# Patient Record
Sex: Female | Born: 1960 | Race: Black or African American | Hispanic: No | Smoking: Never smoker
Health system: Southern US, Community
[De-identification: ages and names within clinical notes are randomized; demographics above are authoritative.]

## PROBLEM LIST (undated history)

## (undated) DIAGNOSIS — D72819 Decreased white blood cell count, unspecified: Secondary | ICD-10-CM

## (undated) DIAGNOSIS — D649 Anemia, unspecified: Secondary | ICD-10-CM

## (undated) DIAGNOSIS — D472 Monoclonal gammopathy: Secondary | ICD-10-CM

## (undated) HISTORY — PX: ABDOMINAL HYSTERECTOMY: SHX81

## (undated) HISTORY — PX: OTHER SURGICAL HISTORY: SHX169

## (undated) HISTORY — DX: Anemia, unspecified: D64.9

## (undated) HISTORY — DX: Monoclonal gammopathy: D47.2

## (undated) HISTORY — DX: Decreased white blood cell count, unspecified: D72.819

---

## 1998-07-21 ENCOUNTER — Ambulatory Visit (HOSPITAL_COMMUNITY): Admission: RE | Admit: 1998-07-21 | Discharge: 1998-07-21 | Payer: Self-pay | Admitting: Obstetrics and Gynecology

## 1999-09-16 ENCOUNTER — Other Ambulatory Visit: Admission: RE | Admit: 1999-09-16 | Discharge: 1999-09-16 | Payer: Self-pay | Admitting: Obstetrics and Gynecology

## 1999-11-28 DIAGNOSIS — D72819 Decreased white blood cell count, unspecified: Secondary | ICD-10-CM

## 1999-11-28 HISTORY — DX: Decreased white blood cell count, unspecified: D72.819

## 2000-10-11 ENCOUNTER — Encounter: Payer: Self-pay | Admitting: Emergency Medicine

## 2000-10-11 ENCOUNTER — Encounter: Admission: RE | Admit: 2000-10-11 | Discharge: 2000-10-11 | Payer: Self-pay | Admitting: Emergency Medicine

## 2000-12-10 ENCOUNTER — Other Ambulatory Visit: Admission: RE | Admit: 2000-12-10 | Discharge: 2000-12-10 | Payer: Self-pay | Admitting: Obstetrics and Gynecology

## 2001-11-13 ENCOUNTER — Encounter: Payer: Self-pay | Admitting: Emergency Medicine

## 2001-11-13 ENCOUNTER — Encounter: Admission: RE | Admit: 2001-11-13 | Discharge: 2001-11-13 | Payer: Self-pay | Admitting: Emergency Medicine

## 2001-12-27 ENCOUNTER — Other Ambulatory Visit: Admission: RE | Admit: 2001-12-27 | Discharge: 2001-12-27 | Payer: Self-pay | Admitting: Internal Medicine

## 2003-02-03 ENCOUNTER — Encounter: Admission: RE | Admit: 2003-02-03 | Discharge: 2003-02-03 | Payer: Self-pay | Admitting: Emergency Medicine

## 2003-02-03 ENCOUNTER — Encounter: Payer: Self-pay | Admitting: Emergency Medicine

## 2004-02-23 ENCOUNTER — Encounter: Admission: RE | Admit: 2004-02-23 | Discharge: 2004-02-23 | Payer: Self-pay | Admitting: Obstetrics and Gynecology

## 2004-12-20 ENCOUNTER — Inpatient Hospital Stay (HOSPITAL_COMMUNITY): Admission: RE | Admit: 2004-12-20 | Discharge: 2004-12-22 | Payer: Self-pay | Admitting: Obstetrics and Gynecology

## 2004-12-20 ENCOUNTER — Encounter (INDEPENDENT_AMBULATORY_CARE_PROVIDER_SITE_OTHER): Payer: Self-pay | Admitting: *Deleted

## 2005-03-02 ENCOUNTER — Encounter: Admission: RE | Admit: 2005-03-02 | Discharge: 2005-03-02 | Payer: Self-pay | Admitting: Obstetrics and Gynecology

## 2006-03-06 ENCOUNTER — Encounter: Admission: RE | Admit: 2006-03-06 | Discharge: 2006-03-06 | Payer: Self-pay | Admitting: Obstetrics and Gynecology

## 2006-03-30 ENCOUNTER — Encounter: Admission: RE | Admit: 2006-03-30 | Discharge: 2006-03-30 | Payer: Self-pay | Admitting: Obstetrics and Gynecology

## 2006-10-30 ENCOUNTER — Ambulatory Visit (HOSPITAL_COMMUNITY): Admission: RE | Admit: 2006-10-30 | Discharge: 2006-10-30 | Payer: Self-pay | Admitting: Obstetrics and Gynecology

## 2006-11-27 HISTORY — PX: BILATERAL SALPINGOOPHORECTOMY: SHX1223

## 2006-12-06 ENCOUNTER — Ambulatory Visit (HOSPITAL_COMMUNITY): Admission: RE | Admit: 2006-12-06 | Discharge: 2006-12-06 | Payer: Self-pay | Admitting: Obstetrics and Gynecology

## 2007-01-30 ENCOUNTER — Ambulatory Visit: Admission: RE | Admit: 2007-01-30 | Discharge: 2007-01-30 | Payer: Self-pay | Admitting: Gynecologic Oncology

## 2007-03-05 ENCOUNTER — Inpatient Hospital Stay (HOSPITAL_COMMUNITY): Admission: RE | Admit: 2007-03-05 | Discharge: 2007-03-09 | Payer: Self-pay | Admitting: Obstetrics and Gynecology

## 2007-03-05 ENCOUNTER — Encounter (INDEPENDENT_AMBULATORY_CARE_PROVIDER_SITE_OTHER): Payer: Self-pay | Admitting: *Deleted

## 2007-04-16 ENCOUNTER — Ambulatory Visit: Admission: RE | Admit: 2007-04-16 | Discharge: 2007-04-16 | Payer: Self-pay | Admitting: Gynecology

## 2007-12-31 ENCOUNTER — Encounter: Admission: RE | Admit: 2007-12-31 | Discharge: 2008-03-30 | Payer: Self-pay | Admitting: Occupational Medicine

## 2010-12-18 ENCOUNTER — Encounter: Payer: Self-pay | Admitting: Obstetrics and Gynecology

## 2011-01-31 ENCOUNTER — Other Ambulatory Visit: Payer: Self-pay | Admitting: Gynecology

## 2011-01-31 DIAGNOSIS — R928 Other abnormal and inconclusive findings on diagnostic imaging of breast: Secondary | ICD-10-CM

## 2011-02-21 ENCOUNTER — Ambulatory Visit
Admission: RE | Admit: 2011-02-21 | Discharge: 2011-02-21 | Disposition: A | Discharge status: Home / Self Care | Payer: BC Managed Care – PPO | Source: Ambulatory Visit | Attending: Gynecology | Admitting: Gynecology

## 2011-02-21 DIAGNOSIS — R928 Other abnormal and inconclusive findings on diagnostic imaging of breast: Secondary | ICD-10-CM

## 2011-04-11 NOTE — Consult Note (Signed)
[name], [name]            ACCOUNT NO.:  [id number]   MEDICAL RECORD NO.:  [id number]          PATIENT TYPE:  OUT   LOCATION:  GYN                          FACILITY:  [facility]   PHYSICIAN:  De Blanch, M.D.DATE OF BIRTH:  Jan 04, 1961   DATE OF CONSULTATION:  04/16/2007  DATE OF DISCHARGE:                                 CONSULTATION   CHIEF COMPLAINT:  Postoperative visit.   The patient returns today for postoperative visit having undergone a  bilateral salpingo-oophorectomy and lysis of adhesions on March 05, 2007.  Final pathology showed the patient had ovarian inclusion cysts and a  hemorrhagic corpus luteum and follicle cyst with surface adhesions.  The  patient has had an uncomplicated postoperative course.  She does note  that she has mild hot flashes and does not have any night sweats.  After  discussing with her the possibility of using hormone replacement  therapy, she does not think that she has enough symptoms to warrant any  pharmaceutical intervention at this time.   Her energy level was good, and she is planning to return to work  shortly.   PHYSICAL EXAMINATION:  VITAL SIGNS:  Weight 174 pounds.  Blood pressure  127/82.  GENERAL:  He is a healthy African-American female in no acute distress.  HEENT:  Negative.  NECK:  Supple without thyromegaly.  There is no supraclavicular or  inguinal adenopathy.  ABDOMEN:  Soft, nontender.  No mass, organomegaly, ascites or hernias  noted.  All incisions are well healed.  PELVIC:  Exam reveals EG/BUS vagina, bladder, urethra are normal.  Cervix shows uterus surgically absent.  Adnexa without masses.  Rectovaginal exam confirms.   IMPRESSION:  Normal postoperative exam.  I discussed the patient's  pathology with her, reiterating that there was no evidence of  malignancy.   The patient also has a urethral diverticulum and is scheduled to be seen  by the urology department at [facility] in July 2008.  She may return to  work  full time and return to full levels of activity.  She will return to see  Dr. Ambrose Mantle for annual gynecologic examination.      De Blanch, M.D.  Electronically Signed     DC/MEDQ  D:  04/16/2007  T:  04/16/2007  Job:  [id number]   cc:   Malachi Pro. Ambrose Mantle, M.D.  Fax: [phone]   Telford Nab, R.N.  [address]  [place], [place] [address]

## 2011-04-14 NOTE — H&P (Signed)
[name], [name]            ACCOUNT NO.:  [id number]   MEDICAL RECORD NO.:  [id number]          PATIENT TYPE:  INP   LOCATION:  NA                           FACILITY:  [facility]   PHYSICIAN:  John T. Kyla Balzarine, M.D.    DATE OF BIRTH:  [birth year]   DATE OF ADMISSION:  03/05/2007  DATE OF DISCHARGE:                              HISTORY & PHYSICAL   CHIEF COMPLAINT:  Pelvic mass with lymphadenopathy.   HISTORY OF PRESENT ILLNESS:  The patient is a 50 year old [name] 1-0-1-1  woman status post hysterectomy 2 years ago for benign fibroids.  She has  urinary incontinence and was evaluated by Dr. Wanda Plump with fiber optic  cystoscopy consistent with a urethral diverticulum, but no vesicovaginal  fistula.  Pelvic mass was identified by Dr. Ambrose Mantle during a pelvic  examination and CT scan of abdomen and pelvis confirmed a large urethral  diverticulum, hemangiomas within the liver parenchyma, enlarged right  common iliac and left external iliac lymph nodes.  A complex cystic and  solid mass, rising from the right adnexa measured 7.8 x 4.5 cm with a  smaller cystic and solid mass arising from the left adnexa measuring 3.3  x 4.4 cm.  CA-125 value was 8.5.  Original CT scan was October 30, 2006.  On 12/06/2006 transabdominal and transvaginal ultrasounds revealed right  ovarian mass measuring 1.8 x 0.8 x 0.7 cm with an associated cyst in the  left ovary measuring 5.7 x 3.6 x 3.8 cm with a 1.5 cm cyst with solid  heterogeneous mass measuring 3.2 x 2.3 x 3.1 cm.  There were septations  and there was no comment regarding vascular flow.  She is otherwise  asymptomatic with the exception of urinary incontinence, but wishes that  the gyno-urology evaluation be deferred until after surgery for her  adnexal masses.   PAST MEDICAL HISTORY:  No major comorbidities.   PAST SURGICAL HISTORY:  TAH via Pfannenstiel's 2 years ago and cesarean  section via vertical midline incision.   MEDICATIONS CURRENTLY:   None.   ALLERGIES:  None.   PERSONAL SOCIAL HISTORY:  The patient is single and denies tobacco or  ethanol; no illicit drug use.   FAMILY HISTORY:  Mother has diabetes and there is no cancer in the  family.   REVIEW OF SYSTEMS:  Other than urinary incontinence, asymptomatic.   PHYSICAL EXAMINATION:  VITAL SIGNS:  Height 5 feet 9 inches with weight  176 pounds, blood pressure 124/86 and pulse 104; afebrile.  GENERAL:  Well-nourished woman in no acute distress, anxious.  NECK:  Supple with no lymphadenopathy or thyromegaly.  LUNGS:  Clear to auscultation bilaterally.  CARDIOVASCULAR:  Regular rate and rhythm without JVD.  ABDOMEN:  Well-healed vertical and transverse skin incisions without  ascites, fluid wave, distension, palpable masses, hernias, or  organomegaly.  LYMPH SURVEY:  Negative for pathologic lymphadenopathy.  EXTREMITIES:  No edema or calf tenderness.  PELVIC:  Examination was performed by Dr. Duard Brady on 01/30/2007 and was  limited secondary to tenderness from the urethral diverticulum with  urine that can be expressed from the diverticulum with palpation.  On  bimanual examination she noted fullness in the right adnexa, but no  distinct mass or nodularity palpable.  Absent uterus and cervix.  The  rectovaginal was confirmatory, but limited by voluntary guarding.   ASSESSMENT:  1. Complex bilateral adnexal masses with normal CA-125 value.  2. Urethral diverticulum.   PLAN:  The patient was unable to schedule a urogynecology evaluation at  [facility] prior to scheduled surgery for her adnexal masses and wishes  to proceed with surgery as planned on 03/05/2007.  At examination under  anesthesia, we will make a decision regarding the type of incision be  used for exploration.  The patient has been counseled by Dr. Duard Brady  regarding the possibility of comprehensive surgical staging or debulking  if a malignancy is encountered.      John T. Kyla Balzarine, M.D.   Electronically Signed     JTS/MEDQ  D:  02/26/2007  T:  02/27/2007  Job:  [id number]   cc:   Malachi Pro. Ambrose Mantle, M.D.  Fax: [phone]   [place] [employer], M.D.  Fax: [phone]   Telford Nab, R.N.  [address]  [place], [place] [address]

## 2011-04-14 NOTE — Op Note (Signed)
[name], [name]            ACCOUNT NO.:  [id number]   MEDICAL RECORD NO.:  [id number]          PATIENT TYPE:  INP   LOCATION:  1621                         FACILITY:  [facility]   PHYSICIAN:  John T. Kyla Balzarine, M.D.    DATE OF BIRTH:  [birth year]   DATE OF PROCEDURE:  03/05/2007  DATE OF DISCHARGE:                               OPERATIVE REPORT   PREOPERATIVE DIAGNOSIS:  Bilateral cystic adnexal masses following  hysterectomy   POSTOPERATIVE DIAGNOSIS:  Pelvic adhesions with peritoneal inclusion  cyst and benign ovarian cysts.   PROCEDURE:  Exploratory laparotomy with lysis of adhesions and bilateral  salpingo-oophorectomy.   SURGEON:  John T. Kyla Balzarine, M.D.   ASSISTANT:  Malachi Pro. Ambrose Mantle, M.D.   ANESTHESIA:  General endotracheal.   INDICATIONS FOR SURGERY:  This patient had bilateral approximately 4 cm  complex adnexal cysts which were persistent following a hysterectomy.  At the time of surgery, she was found to have extensive adhesions  involving predominantly the sigmoid colon, bladder, tubes and ovaries  and bilateral pelvic sidewalls.  The majority of the cysts were actually  comprised of peritoneal inclusion cysts, but small benign cysts were  present on each ovary.  An excess of 45 minutes were spent lysing  adhesions to free the sigmoid colon from the bladder and adnexa in  addition to the time spent resecting the adnexal structures on each  side.   DESCRIPTION OF PROCEDURE:  The patient was prepped and draped in a low  lithotomy position and explored through a prior Pfannenstiel's incision.  The old keloid scar was resected.  After developing the superior and  inferior fascial flaps with sharp and blunt dissection and  electrocautery, the rectus muscles were split at the midline and  peritoneum entered sharply.  The peritoneal incision was extended and  inspection revealed the findings described above.  The small bowel was  packed out of the pelvis and Bookwalter  retractor placed.  Extensive  adhesiolysis was performed to free the sigmoid colon from the pelvic  sidewalls and bladder and the bilateral adnexal masses.  This was  performed with mainly sharp dissection, although blunt dissection was  occasionally used.  On each side, the medial leaf of the broad ligament  was opened parallel to the infundibulopelvic ligament and tube.  The  pararectal space on each side was developed and ureters were identified  before isolating each infundibulopelvic ligament was.  The IP ligaments  were isolated, clamped, divided and ligated with free tie and suture  ligature of 2-0 Vicryl.   After the sigmoid colon was partially freed, additional dissection was  performed to free each of the adnexal structures from their attachments  to the bladder, vaginal cuff, and posterior cul-de-sac.  Throughout the  dissection, the ureters were visualized and freed from jeopardy.  After  resecting each mass, visual inspection revealed no suspicious areas for  malignancy and the adnexal structures were sent for permanent pathology.  The pelvis was copiously irrigated and additional hemostasis achieved  where needed with focal electrocautery.  Packs and retractors were  removed and the abdominal wall was closed in  layers using running 0  Vicryl to reapproximate rectus muscle bellies, running 0 Vicryl to close  the anterior rectus fascia, 3-0 Vicryl to close the subcutaneous adipose  tissue, and  skin clips to close the skin.  The patient tolerated the procedure well  and was returned to the recovery room in stable condition.  Estimated  blood loss was 100 mL, transfusions none, drains and packs Foley  catheter to dependent drainage.  Sponge and sponge counts correct.      John T. Kyla Balzarine, M.D.  Electronically Signed     JTS/MEDQ  D:  03/05/2007  T:  03/05/2007  Job:  [id number]   cc:   Telford Nab, R.N.  [address]  [place], [place] [address]   Malachi Pro. Ambrose Mantle,  M.D.  Fax: [phone]

## 2011-04-14 NOTE — Discharge Summary (Signed)
NAMEMIKEALA, [name]            ACCOUNT NO.:  [id number]   MEDICAL RECORD NO.:  [id number]          PATIENT TYPE:  INP   LOCATION:  1621                         FACILITY:  [facility]   PHYSICIAN:  Danielle Monday, MD        DATE OF BIRTH:  Jul 21, 1961   DATE OF ADMISSION:  03/05/2007  DATE OF DISCHARGE:  03/09/2007                               DISCHARGE SUMMARY   ADMITTING DIAGNOSES:  1. Pelvic mass.  2. Lymphadenopathy.   DISCHARGE DIAGNOSES:  1. Pelvic mass.  2. Lymphadenopathy.  3. Status post exploratory laparotomy, lysis of adhesions, and      bilateral salpingo-oophorectomy.   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  She has a pelvic mass that was  identified during a pelvic exam, and a CT scan verified this as well.  She had enlarged lymph nodes on this CT scan.  The mass was described as  both cystic and solid arising from the right adnexa, approximately 7.8 x  4.5 cm.  Left adnexa also had a smaller mass approximately 3.3 x 4.4 cm.  A CA 125 was found to be 8.5.  The patient had a consult with [facility]  Gynecology/Oncology and was admitted for a bilateral salpingo-  oophorectomy, possible staging biopsies.   PAST MEDICAL HISTORY:  Not significant.   PAST SURGICAL HISTORY:  Significant for a total abdominal hysterectomy  as well as a cesarean section.   MEDICATIONS:  None.   ALLERGIES:  None.   SOCIAL HISTORY:  The patient denies alcohol, tobacco, or drug use and is  single.   FAMILY HISTORY:  Significant for diabetes in her mother.   REVIEW OF SYSTEMS:  Significant only for the urinary incontinence for  the 14 point review.   On admission, she is afebrile, vital signs stable, was admitted for this  surgery on March 05, 2007.  She had an ex-lap BSO at this time, lysis of  adhesions.  The surgery is performed by Dr. Ronita Hipps and  Dr. Tracey Harries.  She tolerated the procedure well, and her postoperative course  was complicated by a postoperative fever.  Her T-max was approximately  101.4.  A cath UA revealed only red blood cells.  A chest x-ray was  negative.  The workup for the fever was found to be negative.  She was  ambulating well, tolerating diet, passing gas, and, in fact, had a bowel  movement.  She was discharged to home on postoperative day 4, having  remained afebrile for greater than 36 hours with vital signs that were  stable at this time.  She was ambulating without difficulty, anxious to  go home, and her pain was well controlled.  Following the surgery, her  hemoglobin had decreased from 9.5 preoperatively to 8.7 postoperatively.  Exam on the day of discharge, she was in no apparent distress.  Cardiovascular regular rate and rhythm.  Lungs are clear to auscultation  bilaterally.  Abdomen is soft, appropriately tender, and good bowel  sounds.  Staples were removed, and Steri-Strips were applied.  Her  incision was clean, dry, and intact.  Extremities were symmetric and  nontender.  She was discharged to home with routine discharge  instructions and numbers to call with any questions or problems.  She  was discharged to home with prescriptions for Percocet, Motrin, and  iron.  She was advised to take this daily.  She will follow up with Dr.  Ambrose Mantle in approximately 2 weeks.  She voiced understanding to these  instructions and will go home or call us with any questions or problems.      Danielle Monday, MD  Electronically Signed     JB/MEDQ  D:  03/09/2007  T:  03/09/2007  Job:  [id number]

## 2011-06-28 DIAGNOSIS — D472 Monoclonal gammopathy: Secondary | ICD-10-CM

## 2011-06-28 HISTORY — DX: Monoclonal gammopathy: D47.2

## 2011-07-21 ENCOUNTER — Other Ambulatory Visit (HOSPITAL_COMMUNITY): Payer: Self-pay | Admitting: Oncology

## 2011-07-21 ENCOUNTER — Encounter (HOSPITAL_BASED_OUTPATIENT_CLINIC_OR_DEPARTMENT_OTHER): Payer: BC Managed Care – PPO | Admitting: Oncology

## 2011-07-21 DIAGNOSIS — D539 Nutritional anemia, unspecified: Secondary | ICD-10-CM

## 2011-07-21 DIAGNOSIS — D649 Anemia, unspecified: Secondary | ICD-10-CM

## 2011-07-21 DIAGNOSIS — D72819 Decreased white blood cell count, unspecified: Secondary | ICD-10-CM

## 2011-07-21 LAB — COMPREHENSIVE METABOLIC PANEL
ALT: 15 U/L (ref 0–35)
AST: 20 U/L (ref 0–37)
Calcium: 9.9 mg/dL (ref 8.4–10.5)

## 2011-07-21 LAB — CBC WITH DIFFERENTIAL/PLATELET
Basophils Absolute: 0 10*3/uL (ref 0.0–0.1)
EOS%: 0.5 % (ref 0.0–7.0)
HCT: 32.5 % — ABNORMAL LOW (ref 34.8–46.6)
LYMPH%: 46.8 % (ref 14.0–49.7)
MCH: 29.6 pg (ref 25.1–34.0)
MCV: 86.3 fL (ref 79.5–101.0)
MONO#: 0.2 10*3/uL (ref 0.1–0.9)
NEUT#: 1 10*3/uL — ABNORMAL LOW (ref 1.5–6.5)
NEUT%: 42.4 % (ref 38.4–76.8)
RBC: 3.77 10*6/uL (ref 3.70–5.45)
RDW: 14.9 % — ABNORMAL HIGH (ref 11.2–14.5)
lymph#: 1 10*3/uL (ref 0.9–3.3)

## 2011-07-21 LAB — MORPHOLOGY: PLT EST: ADEQUATE

## 2011-07-24 ENCOUNTER — Encounter: Payer: BC Managed Care – PPO | Admitting: Oncology

## 2011-07-24 ENCOUNTER — Other Ambulatory Visit (HOSPITAL_COMMUNITY): Payer: Self-pay | Admitting: Oncology

## 2011-07-24 LAB — FOLATE RBC: RBC Folate: 477 ng/mL (ref >=366)

## 2011-07-24 LAB — RETICULOCYTES (CHCC)
ABS Retic: 35.1 10*3/uL (ref 19.0–186.0)
RBC.: 3.9 MIL/uL (ref 3.87–5.11)

## 2011-07-24 LAB — IRON AND TIBC: UIBC: 241 ug/dL

## 2011-07-24 LAB — VITAMIN B12: Vitamin B-12: 502 pg/mL (ref 211–911)

## 2011-07-26 LAB — IMMUNOFIXATION ELECTROPHORESIS: IgG (Immunoglobin G), Serum: 2570 mg/dL — ABNORMAL HIGH (ref 690–1700)

## 2011-07-28 ENCOUNTER — Other Ambulatory Visit (HOSPITAL_COMMUNITY): Payer: Self-pay | Admitting: Oncology

## 2011-07-28 ENCOUNTER — Encounter (HOSPITAL_BASED_OUTPATIENT_CLINIC_OR_DEPARTMENT_OTHER): Payer: BC Managed Care – PPO | Admitting: Oncology

## 2011-07-28 DIAGNOSIS — D72819 Decreased white blood cell count, unspecified: Secondary | ICD-10-CM

## 2011-07-28 DIAGNOSIS — D649 Anemia, unspecified: Secondary | ICD-10-CM

## 2011-07-28 DIAGNOSIS — D472 Monoclonal gammopathy: Secondary | ICD-10-CM

## 2011-07-28 DIAGNOSIS — D539 Nutritional anemia, unspecified: Secondary | ICD-10-CM

## 2011-08-01 ENCOUNTER — Encounter (HOSPITAL_BASED_OUTPATIENT_CLINIC_OR_DEPARTMENT_OTHER): Payer: BC Managed Care – PPO | Admitting: Oncology

## 2011-08-01 ENCOUNTER — Other Ambulatory Visit (HOSPITAL_COMMUNITY): Payer: Self-pay | Admitting: Oncology

## 2011-08-01 DIAGNOSIS — D472 Monoclonal gammopathy: Secondary | ICD-10-CM

## 2011-08-02 LAB — LACTATE DEHYDROGENASE: LDH: 169 U/L (ref 94–250)

## 2011-08-02 LAB — KAPPA/LAMBDA LIGHT CHAINS
Kappa free light chain: 1.32 mg/dL (ref 0.33–1.94)
Kappa:Lambda Ratio: 1.83 — ABNORMAL HIGH (ref 0.26–1.65)

## 2011-08-02 LAB — BETA 2 MICROGLOBULIN, SERUM: Beta-2 Microglobulin: 1.18 mg/L (ref 1.01–1.73)

## 2011-08-03 ENCOUNTER — Other Ambulatory Visit (HOSPITAL_COMMUNITY): Payer: Self-pay | Admitting: Oncology

## 2011-08-03 DIAGNOSIS — D649 Anemia, unspecified: Secondary | ICD-10-CM

## 2011-08-03 LAB — UIFE/LIGHT CHAINS/TP QN, 24-HR UR
Albumin, U: DETECTED
Alpha 2, Urine: DETECTED — AB
Beta, Urine: DETECTED — AB
Gamma Globulin, Urine: DETECTED — AB
Total Protein, Urine-Ur/day: 32 mg/d (ref 10–140)

## 2011-08-11 ENCOUNTER — Other Ambulatory Visit (HOSPITAL_COMMUNITY): Payer: Self-pay | Admitting: Oncology

## 2011-08-11 ENCOUNTER — Ambulatory Visit (HOSPITAL_COMMUNITY): Payer: BC Managed Care – PPO | Attending: Oncology

## 2011-08-11 DIAGNOSIS — D509 Iron deficiency anemia, unspecified: Secondary | ICD-10-CM | POA: Insufficient documentation

## 2011-08-11 DIAGNOSIS — D472 Monoclonal gammopathy: Secondary | ICD-10-CM

## 2011-08-11 DIAGNOSIS — D72819 Decreased white blood cell count, unspecified: Secondary | ICD-10-CM | POA: Insufficient documentation

## 2011-08-11 DIAGNOSIS — E8809 Other disorders of plasma-protein metabolism, not elsewhere classified: Secondary | ICD-10-CM | POA: Insufficient documentation

## 2011-08-11 LAB — DIFFERENTIAL
Basophils Absolute: 0 10*3/uL (ref 0.0–0.1)
Basophils Relative: 1 % (ref 0–1)
Eosinophils Absolute: 0 10*3/uL (ref 0.0–0.7)
Eosinophils Relative: 1 % (ref 0–5)
Lymphocytes Relative: 49 % — ABNORMAL HIGH (ref 12–46)
Lymphs Abs: 0.9 10*3/uL (ref 0.7–4.0)
Monocytes Absolute: 0.3 10*3/uL (ref 0.1–1.0)
Monocytes Relative: 14 % — ABNORMAL HIGH (ref 3–12)
Neutro Abs: 0.7 10*3/uL — ABNORMAL LOW (ref 1.7–7.7)
Neutrophils Relative %: 35 % — ABNORMAL LOW (ref 43–77)

## 2011-08-11 LAB — CBC
HCT: 31.3 % — ABNORMAL LOW (ref 36.0–46.0)
Hemoglobin: 10.2 g/dL — ABNORMAL LOW (ref 12.0–15.0)
MCH: 28.4 pg (ref 26.0–34.0)
MCHC: 32.6 g/dL (ref 30.0–36.0)
MCV: 87.2 fL (ref 78.0–100.0)
Platelets: 231 10*3/uL (ref 150–400)
RBC: 3.59 MIL/uL — ABNORMAL LOW (ref 3.87–5.11)
RDW: 14.1 % (ref 11.5–15.5)
WBC: 1.9 10*3/uL — ABNORMAL LOW (ref 4.0–10.5)

## 2011-08-16 ENCOUNTER — Ambulatory Visit (HOSPITAL_COMMUNITY)
Admission: RE | Admit: 2011-08-16 | Discharge: 2011-08-16 | Disposition: A | Discharge status: Home / Self Care | Payer: BC Managed Care – PPO | Source: Ambulatory Visit | Attending: Oncology | Admitting: Oncology

## 2011-08-16 DIAGNOSIS — M779 Enthesopathy, unspecified: Secondary | ICD-10-CM | POA: Insufficient documentation

## 2011-08-16 DIAGNOSIS — D72819 Decreased white blood cell count, unspecified: Secondary | ICD-10-CM | POA: Insufficient documentation

## 2011-08-16 DIAGNOSIS — M948X9 Other specified disorders of cartilage, unspecified sites: Secondary | ICD-10-CM | POA: Insufficient documentation

## 2011-08-16 DIAGNOSIS — M479 Spondylosis, unspecified: Secondary | ICD-10-CM | POA: Insufficient documentation

## 2011-08-16 DIAGNOSIS — D649 Anemia, unspecified: Secondary | ICD-10-CM | POA: Insufficient documentation

## 2011-08-16 DIAGNOSIS — M852 Hyperostosis of skull: Secondary | ICD-10-CM | POA: Insufficient documentation

## 2011-08-25 ENCOUNTER — Encounter (HOSPITAL_BASED_OUTPATIENT_CLINIC_OR_DEPARTMENT_OTHER): Payer: BC Managed Care – PPO | Admitting: Oncology

## 2011-08-25 ENCOUNTER — Other Ambulatory Visit (HOSPITAL_COMMUNITY): Payer: Self-pay | Admitting: Oncology

## 2011-08-25 DIAGNOSIS — D472 Monoclonal gammopathy: Secondary | ICD-10-CM

## 2011-08-25 DIAGNOSIS — D72819 Decreased white blood cell count, unspecified: Secondary | ICD-10-CM

## 2011-08-25 DIAGNOSIS — C9000 Multiple myeloma not having achieved remission: Secondary | ICD-10-CM

## 2011-08-25 DIAGNOSIS — D649 Anemia, unspecified: Secondary | ICD-10-CM

## 2011-08-25 DIAGNOSIS — D539 Nutritional anemia, unspecified: Secondary | ICD-10-CM

## 2011-08-25 LAB — CBC WITH DIFFERENTIAL/PLATELET
BASO%: 0.5 % (ref 0.0–2.0)
Eosinophils Absolute: 0 10*3/uL (ref 0.0–0.5)
LYMPH%: 26.2 % (ref 14.0–49.7)
MCHC: 34.1 g/dL (ref 31.5–36.0)
MONO#: 0.3 10*3/uL (ref 0.1–0.9)
NEUT#: 2 10*3/uL (ref 1.5–6.5)
Platelets: 249 10*3/uL (ref 145–400)
RBC: 3.69 10*6/uL — ABNORMAL LOW (ref 3.70–5.45)
RDW: 14.7 % — ABNORMAL HIGH (ref 11.2–14.5)
WBC: 3.1 10*3/uL — ABNORMAL LOW (ref 3.9–10.3)
lymph#: 0.8 10*3/uL — ABNORMAL LOW (ref 0.9–3.3)

## 2011-08-28 LAB — COMPREHENSIVE METABOLIC PANEL
ALT: 11 U/L (ref 0–35)
CO2: 22 mEq/L (ref 19–32)
Creatinine, Ser: 0.64 mg/dL (ref 0.50–1.10)
Glucose, Bld: 99 mg/dL (ref 70–99)
Total Bilirubin: 0.5 mg/dL (ref 0.3–1.2)

## 2011-08-28 LAB — URIC ACID: Uric Acid, Serum: 4.3 mg/dL (ref 2.4–7.0)

## 2011-08-28 LAB — IGG, IGA, IGM
IgA: 141 mg/dL (ref 69–380)
IgG (Immunoglobin G), Serum: 2750 mg/dL — ABNORMAL HIGH (ref 690–1700)
IgM, Serum: 6 mg/dL — ABNORMAL LOW (ref 52–322)

## 2011-08-28 LAB — KAPPA/LAMBDA LIGHT CHAINS
Kappa free light chain: 1.75 mg/dL (ref 0.33–1.94)
Lambda Free Lght Chn: 1.09 mg/dL (ref 0.57–2.63)

## 2011-09-01 ENCOUNTER — Ambulatory Visit (HOSPITAL_COMMUNITY)
Admission: RE | Admit: 2011-09-01 | Discharge: 2011-09-01 | Disposition: A | Discharge status: Home / Self Care | Payer: BC Managed Care – PPO | Source: Ambulatory Visit | Attending: Oncology | Admitting: Oncology

## 2011-09-01 DIAGNOSIS — C9000 Multiple myeloma not having achieved remission: Secondary | ICD-10-CM

## 2011-09-01 DIAGNOSIS — D649 Anemia, unspecified: Secondary | ICD-10-CM | POA: Insufficient documentation

## 2011-09-01 DIAGNOSIS — M948X9 Other specified disorders of cartilage, unspecified sites: Secondary | ICD-10-CM | POA: Insufficient documentation

## 2011-09-01 DIAGNOSIS — D72819 Decreased white blood cell count, unspecified: Secondary | ICD-10-CM | POA: Insufficient documentation

## 2011-09-01 MED ORDER — GADOBENATE DIMEGLUMINE 529 MG/ML IV SOLN
16.0000 mL | Freq: Once | INTRAVENOUS | Status: AC | PRN
  Administered 2011-09-01: 16 mL via INTRAVENOUS

## 2011-09-12 ENCOUNTER — Encounter: Payer: Self-pay | Admitting: Medical Oncology

## 2011-10-03 LAB — RETICULOCYTES (CHCC)
ABS Retic: 35.1 10*3/uL (ref 19.0–186.0)
RBC.: 3.9 MIL/uL (ref 3.87–5.11)
Retic Ct Pct: 0.9 % (ref 0.4–2.3)

## 2011-10-03 LAB — IRON AND TIBC
%SAT: 18 % — ABNORMAL LOW (ref 20–55)
Iron: 53 ug/dL (ref 42–145)
TIBC: 294 ug/dL (ref 250–470)
UIBC: 241 ug/dL

## 2011-10-03 LAB — SEDIMENTATION RATE: Sed Rate: 44 mm/hr — ABNORMAL HIGH (ref 0–22)

## 2011-10-03 LAB — VITAMIN B12: Vitamin B-12: 502 pg/mL (ref 211–911)

## 2011-10-03 LAB — FOLATE RBC: RBC Folate: 477 ng/mL (ref >=366)

## 2011-10-05 ENCOUNTER — Other Ambulatory Visit (HOSPITAL_COMMUNITY): Payer: Self-pay | Admitting: Oncology

## 2011-10-05 ENCOUNTER — Other Ambulatory Visit (HOSPITAL_BASED_OUTPATIENT_CLINIC_OR_DEPARTMENT_OTHER): Payer: BC Managed Care – PPO | Admitting: Lab

## 2011-10-05 ENCOUNTER — Ambulatory Visit (HOSPITAL_BASED_OUTPATIENT_CLINIC_OR_DEPARTMENT_OTHER): Payer: BC Managed Care – PPO | Admitting: Oncology

## 2011-10-05 ENCOUNTER — Telehealth: Payer: Self-pay | Admitting: Oncology

## 2011-10-05 DIAGNOSIS — D649 Anemia, unspecified: Secondary | ICD-10-CM

## 2011-10-05 DIAGNOSIS — D472 Monoclonal gammopathy: Secondary | ICD-10-CM

## 2011-10-05 DIAGNOSIS — C9000 Multiple myeloma not having achieved remission: Secondary | ICD-10-CM | POA: Insufficient documentation

## 2011-10-05 LAB — CBC WITH DIFFERENTIAL/PLATELET
BASO%: 2.9 % — ABNORMAL HIGH (ref 0.0–2.0)
HCT: 28.8 % — ABNORMAL LOW (ref 34.8–46.6)
MCHC: 33.7 g/dL (ref 31.5–36.0)
MONO#: 0.3 10*3/uL (ref 0.1–0.9)
NEUT%: 49.2 % (ref 38.4–76.8)
RBC: 3.29 10*6/uL — ABNORMAL LOW (ref 3.70–5.45)
RDW: 15.2 % — ABNORMAL HIGH (ref 11.2–14.5)
WBC: 3.3 10*3/uL — ABNORMAL LOW (ref 3.9–10.3)
lymph#: 1.2 10*3/uL (ref 0.9–3.3)

## 2011-10-05 LAB — LACTATE DEHYDROGENASE: LDH: 195 U/L (ref 94–250)

## 2011-10-05 LAB — COMPREHENSIVE METABOLIC PANEL
ALT: 12 U/L (ref 0–35)
Albumin: 3.6 g/dL (ref 3.5–5.2)
CO2: 27 mEq/L (ref 19–32)
Calcium: 9.5 mg/dL (ref 8.4–10.5)
Chloride: 105 mEq/L (ref 96–112)
Potassium: 4.3 mEq/L (ref 3.5–5.3)
Sodium: 140 mEq/L (ref 135–145)
Total Protein: 8.5 g/dL — ABNORMAL HIGH (ref 6.0–8.3)

## 2011-10-05 NOTE — Progress Notes (Signed)
  This office note has been dictated.  #[id number]

## 2011-10-05 NOTE — Telephone Encounter (Signed)
gv pt appt schedule for dec/jan °

## 2011-10-05 NOTE — Progress Notes (Signed)
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 Raeven was last seen by [place] on 08/25/2011.  Today, she is accompanied by her sister Robynn Pane, and friend Lorenda Hatchet.  Alicen currently is without any complaints or problems. She said she had the flu last week.  She had fever and decreased energy as well as some malaise and myalgias.  She says she is recovered.  She is without any musculoskeletal pain.  She underwent an MRI of the pelvis with and without IV contrast on 09/02/2011 to follow up on the possibility that she had a lytic lesion in her left ischium.  The MRI came back basically negative with the conclusion that the previously seen abnormality on the bone survey from 08/16/2011 was an artifact.  As stated, Kahlan is without complaints today, feels generally well. Today's session was spent mostly in discussion.  The session lasted well over an hour.  Andalyn's sister and friend had many questions to ask about her condition and treatment approaches.  MEDICINES:  Reviewed and recorded.  Alizandra is on vitamin D and ferrous sulfate.  PHYSICAL EXAM:  General:  She looks well.  Vital Signs:  Weight is 175 pounds.  Height 5 feet 8 inches, body surface area 1.95 sq m.  HEENT: There is no scleral icterus.  Mouth and pharynx are benign.  No peripheral adenopathy palpable.  Heart and Lungs:  Normal.  Breasts: Not examined.  Lymphatics:  No axillary or inguinal adenopathy. Abdomen:  Benign with no organomegaly or masses palpable.  Back:  No skeletal tenderness or deformity.  Extremities:  No peripheral edema, clubbing, or calf tenderness.  LABORATORY DATA:  Today, white count 3.3, ANC 1.6, hemoglobin 9.7, hematocrit 28.8, platelets 294,000.  Chemistries were notable for a total protein of 8.5 (slightly elevated), albumin 3.6, and  globulins were therefore 4.9.  LDH and liver function tests were normal. Quantitative immunoglobulins and serum for light chains are currently pending.  Quantitative immunoglobulins from 08/25/2011 notable for an IgG level of 2750 up from 2570 on 07/24/2011.  IgA was 141, which is stable, and the IgM was 6, which is low.  Kappa/lambda ratio was normal at 1.61 on 08/25/2011.  It will be recalled that the patient had a bone marrow on 08/11/2011 that showed 12% plasma cells by morphology, 20% plasma cells by immuno markers.  The patient has no evidence of bone disease, renal disease, or hypercalcemia.  She is asymptomatic.  Her 24- hour urine collection on 08/01/2011 yielded 32 mg of protein.  However, the urine fixation immunoelectrophoresis showed a monoclonal IgG heavy chain with associated kappa light chain.  IMPRESSION AND PLAN:  Kody's condition clinically remains stable.  I am concerned about the slight drop in her hemoglobin and hematocrit to 9.7 and 28.8, down from 11.0 and 32.2 back on 08/25/2011.  As stated, we have a very lengthy discussion with Paulette and her family.  I explained all of findings and why we were adopting a conservative approach.  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  I do not know whether this is going to accelerate rapidly or whether we will be able to simply observe her for a period of time.  It is possible that the drop in her hemoglobin and hematocrit may be secondary to her the flu-like illness  that she had within the past week or so.  We await the results of the protein studies today.  As stated, today's session was rather lengthy well over an hour, and we spent most of that in discussion.  We are going to check a CBC and quantitative immunoglobulins in 1 month and plan to see Bryli again in 2 months.  We will check CBC, chemistries, and quantitative immunoglobulins in 2 months.  If  it appears that the IgG level is rising rapidly, then we may have to tighten up on our followup.  On the other hand, if it looks like Amry's condition and protein studies are stable, then we may be able to spread her appointments out.  All of this was explained in detail.  Atlee has not had a flu shot this year, nor has she ever had Pneumovax apparently.  I encouraged her to obtain these vaccinations. She said she will follow through probably receiving the Pneumovax from her primary care physician, possibly the flu shot from a pharmacy.    ______________________________ Samul Dada, M.D. DSM/MEDQ  D:  10/05/2011  T:  10/05/2011  Job:  [id number]

## 2011-10-09 LAB — IGG, IGA, IGM
IgA: 151 mg/dL (ref 69–380)
IgG (Immunoglobin G), Serum: 2820 mg/dL — ABNORMAL HIGH (ref 690–1700)

## 2011-10-09 LAB — KAPPA/LAMBDA LIGHT CHAINS
Kappa free light chain: 1.87 mg/dL (ref 0.33–1.94)
Kappa:Lambda Ratio: 2.83 — ABNORMAL HIGH (ref 0.26–1.65)
Lambda Free Lght Chn: 0.66 mg/dL (ref 0.57–2.63)

## 2011-11-02 ENCOUNTER — Other Ambulatory Visit (HOSPITAL_BASED_OUTPATIENT_CLINIC_OR_DEPARTMENT_OTHER): Payer: BC Managed Care – PPO

## 2011-11-02 DIAGNOSIS — C9000 Multiple myeloma not having achieved remission: Secondary | ICD-10-CM

## 2011-11-02 LAB — CBC WITH DIFFERENTIAL/PLATELET
BASO%: 0.4 % (ref 0.0–2.0)
Basophils Absolute: 0 10*3/uL (ref 0.0–0.1)
EOS%: 0.4 % (ref 0.0–7.0)
HCT: 30.8 % — ABNORMAL LOW (ref 34.8–46.6)
HGB: 10.2 g/dL — ABNORMAL LOW (ref 11.6–15.9)
MCH: 29 pg (ref 25.1–34.0)
MCHC: 33.1 g/dL (ref 31.5–36.0)
MCV: 87.5 fL (ref 79.5–101.0)
MONO%: 10.6 % (ref 0.0–14.0)
NEUT%: 46.3 % (ref 38.4–76.8)
lymph#: 1.6 10*3/uL (ref 0.9–3.3)

## 2011-11-03 LAB — IGG, IGA, IGM
IgA: 132 mg/dL (ref 69–380)
IgM, Serum: 85 mg/dL (ref 52–322)

## 2011-11-06 ENCOUNTER — Telehealth: Payer: Self-pay | Admitting: Medical Oncology

## 2011-11-06 NOTE — Telephone Encounter (Signed)
No answer or machine.

## 2011-11-06 NOTE — Telephone Encounter (Signed)
I called to let pt know her protein levels are stable per Dr. Arline Asp

## 2011-12-01 ENCOUNTER — Other Ambulatory Visit: Payer: BC Managed Care – PPO

## 2011-12-01 ENCOUNTER — Ambulatory Visit: Payer: BC Managed Care – PPO | Admitting: Oncology

## 2012-02-12 IMAGING — CR DG BONE SURVEY MET
9 of 10 series · 9 of 10 positions shown · non-contrast
Comparison: Two-view lumbar spine x-ray 03/19/2008.  No other prior
osseous imaging.

CLINICAL DATA: Anemia and leukopenia.  Elevated free Kappa Light
Chains in the urine. Elevated serum IgG.

METASTATIC BONE SURVEY 08/16/2011:

[w c-spine lat]
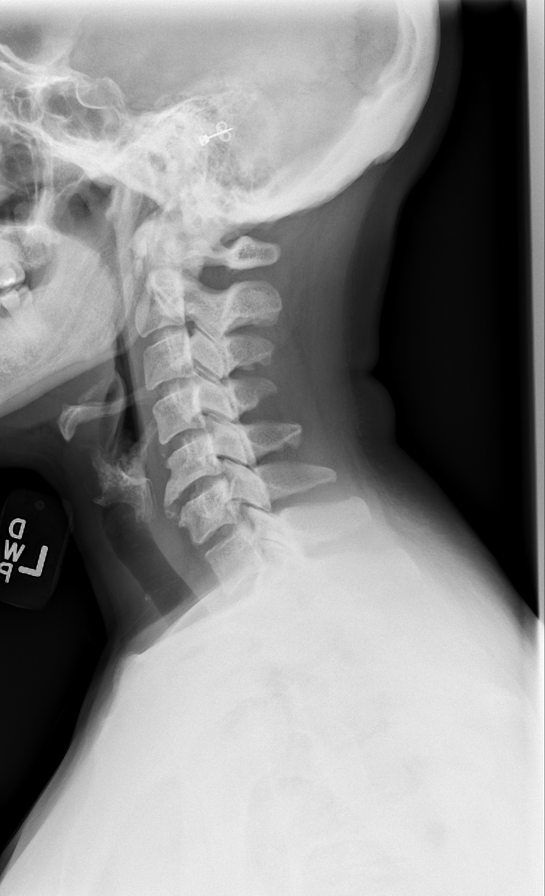

[w skull lat]
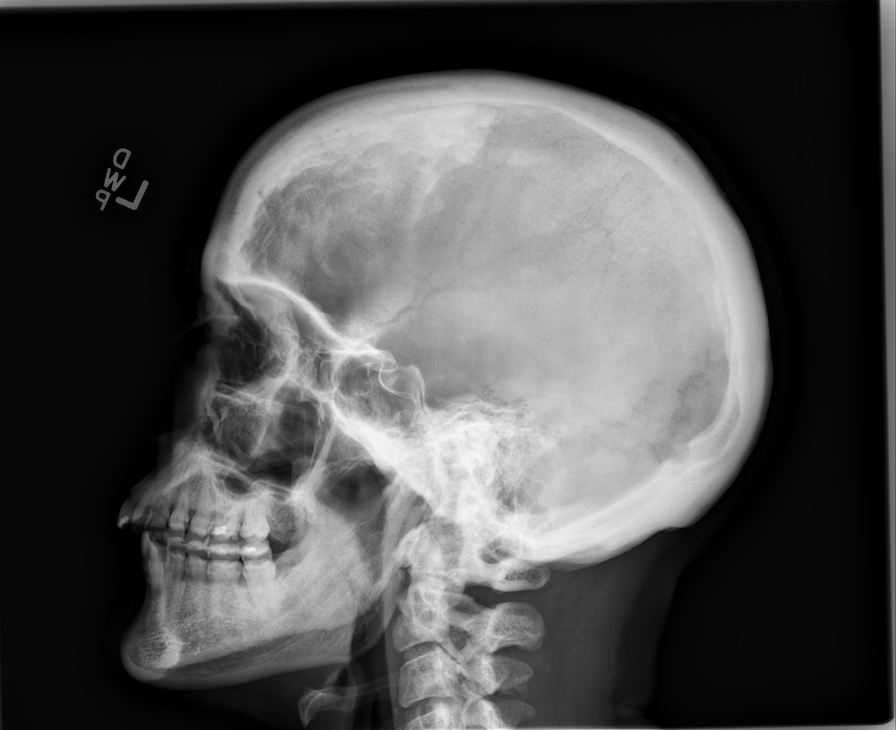

[w swimmers view]
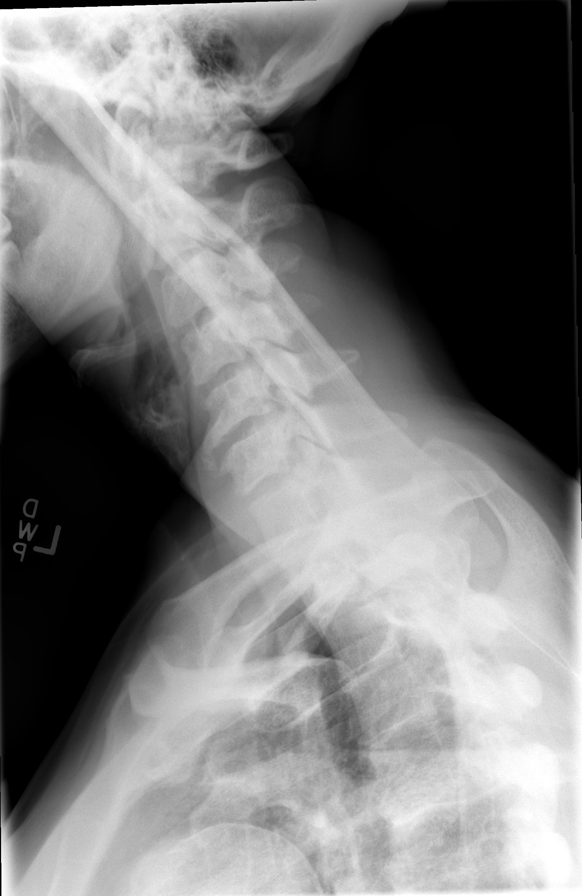

[w c-spine a.p.]
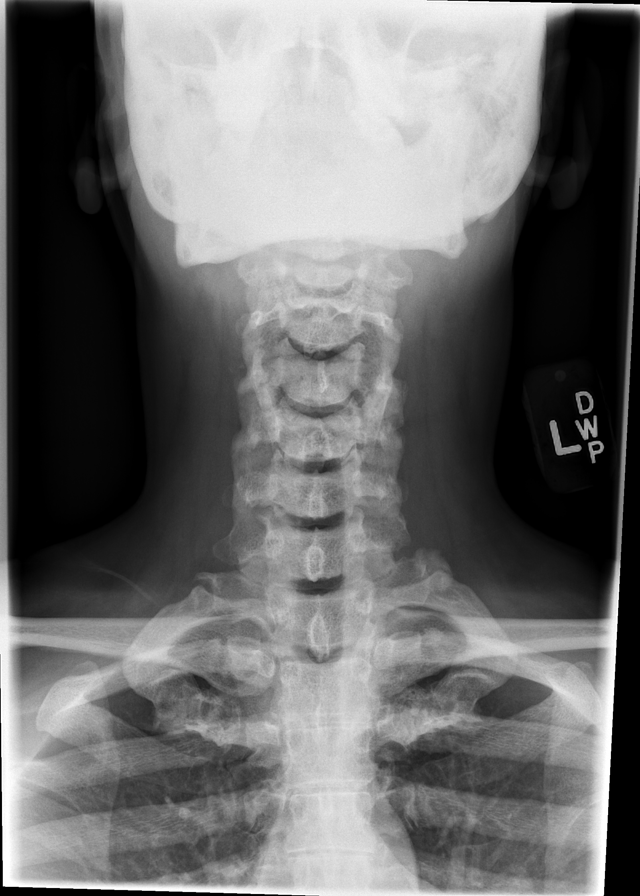

[t t-spine a.p.]
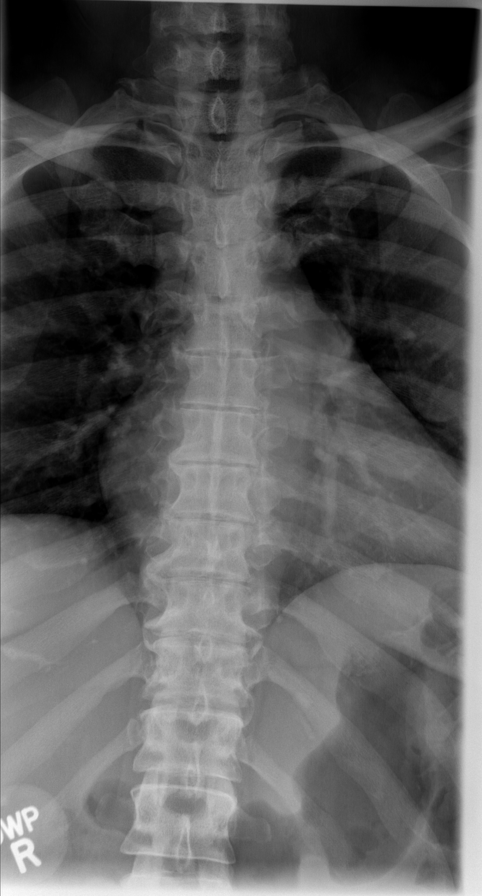

[t l-spine a.p.]
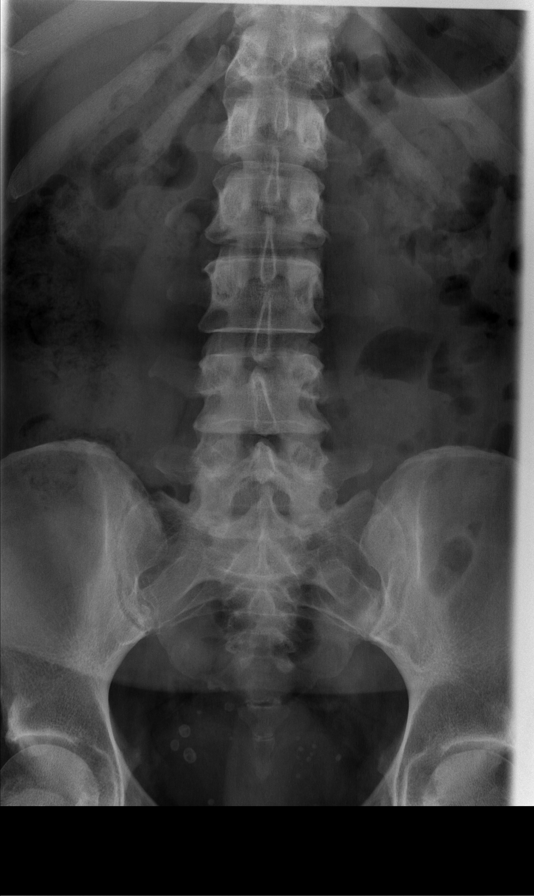

[t pelvis a.p.]
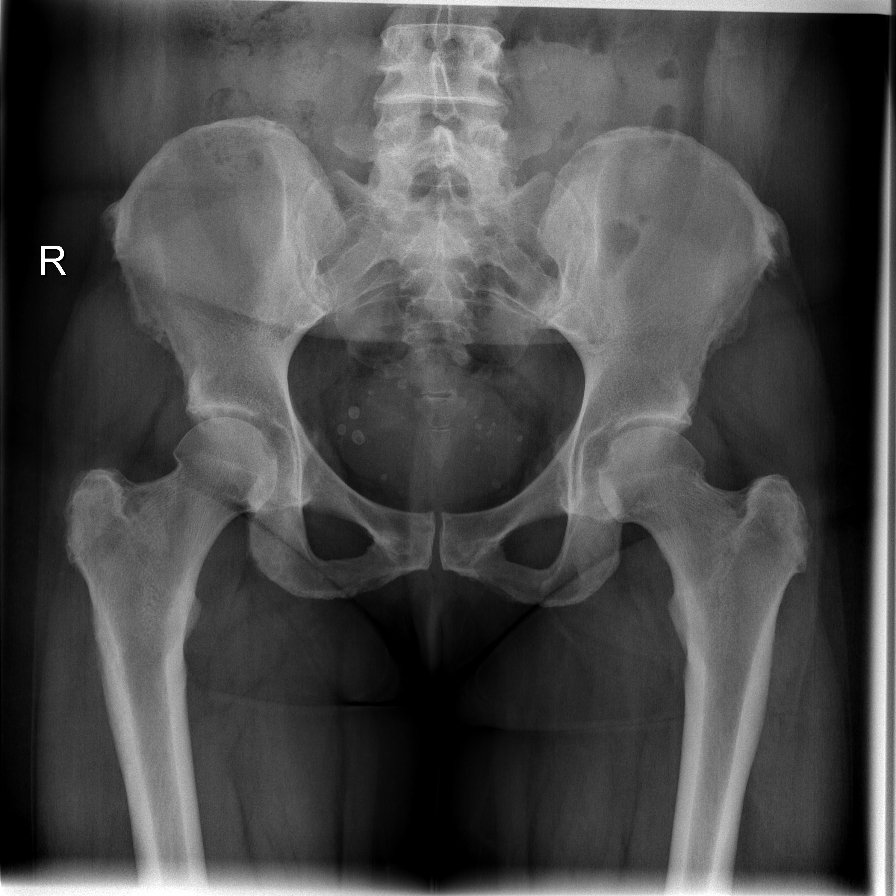

[t femur with hip  ap left]
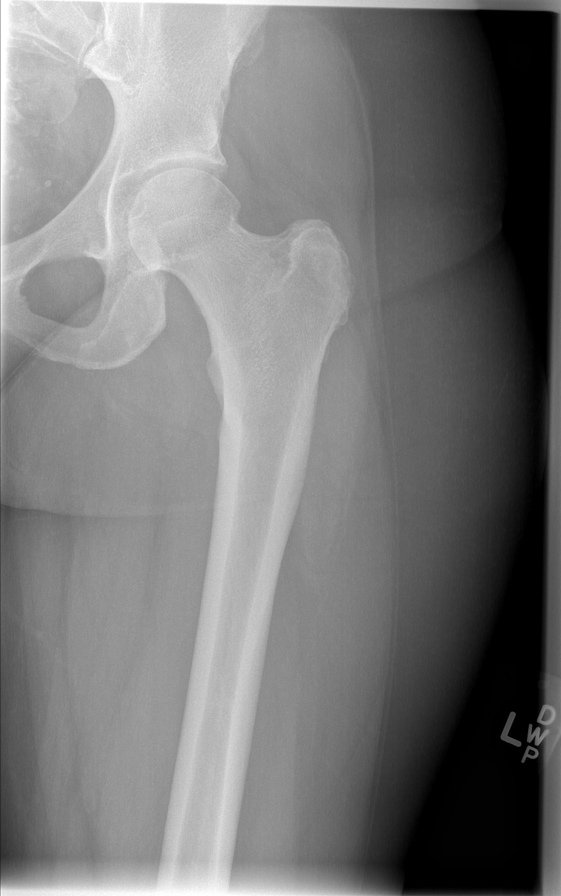

[t femur with knee ap left]
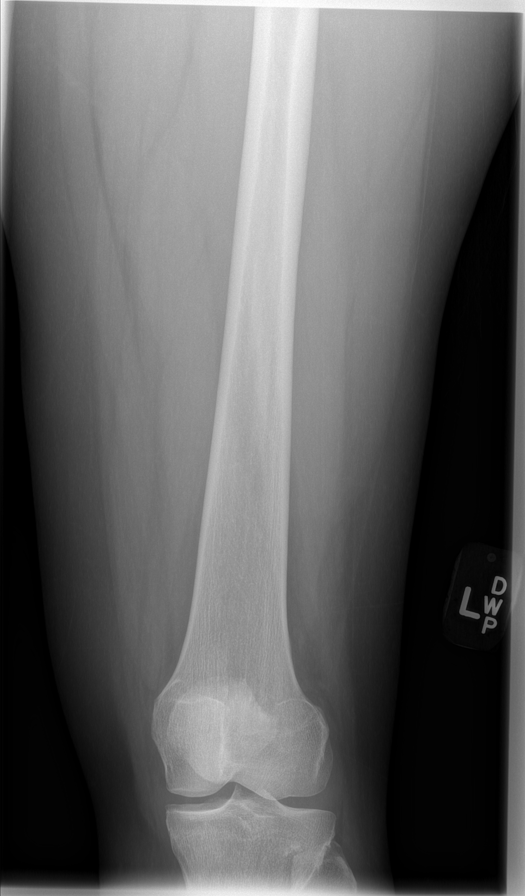

[9 of 10 positions shown; findings below may reference images not displayed]

FINDINGS: The following bones were evaluated:

Lateral skull: Hyperostosis frontalis interna.  No lytic or
sclerotic skull lesions.

Cervical spine, AP and lateral views: Mild disc space narrowing and
associated endplate hypertrophic changes at C5-6 and C6-7.  No
lytic or sclerotic lesions.

Thoracic spine, AP and lateral views: 12 rib-bearing thoracic
vertebrae with anatomic alignment.  No fractures.  Moderate mid and
lower thoracic spondylosis.  Pedicles intact.  No lytic or
sclerotic lesions.

Lumbar spine, AP and lateral views: Five non-rib bearing lumbar
vertebrae with anatomic alignment.  No fractures.  Stable mild disc
space narrowing at L1-2, L2-3, and L3-4.  No lytic or sclerotic
lesions.

Bilateral shoulders, AP views: Subacromial spaces well preserved
bilaterally.  Both acromioclavicular joints intact without
significant degenerative change.  No lytic or sclerotic lesions.

Bilateral humeri, AP views: No lytic or sclerotic lesions.

Pelvis, AP view: Solitary lytic lesion in the left ischium.  No
lytic or sclerotic lesions elsewhere.  Intact sacroiliac joints and
symphysis pubis.  Hip joints intact with well-preserved joint
spaces.  Enthesopathy at musculotendinous insertions on the iliac
spines.

Bilateral femora, AP views: No lytic or sclerotic lesions.
Enthesopathy at the gluteal tendon insertions on the greater
trochanters.
IMPRESSION: 1.  Solitary lytic lesion in the left ischium.  No lytic or
sclerotic osseous lesions elsewhere in the visualized skeleton.
2.  Degenerative changes in the spine at as detailed above.

## 2013-02-03 ENCOUNTER — Other Ambulatory Visit: Payer: Self-pay | Admitting: Gynecology

## 2013-02-03 ENCOUNTER — Other Ambulatory Visit: Payer: Self-pay | Admitting: Family Medicine

## 2013-02-12 ENCOUNTER — Other Ambulatory Visit: Payer: BC Managed Care – PPO

## 2013-02-12 ENCOUNTER — Ambulatory Visit
Admission: RE | Admit: 2013-02-12 | Discharge: 2013-02-12 | Disposition: A | Discharge status: Home / Self Care | Payer: 59 | Source: Ambulatory Visit | Attending: Family Medicine | Admitting: Family Medicine

## 2013-02-12 DIAGNOSIS — N6320 Unspecified lump in the left breast, unspecified quadrant: Secondary | ICD-10-CM

## 2013-04-24 ENCOUNTER — Encounter: Payer: Self-pay | Admitting: Gynecology

## 2013-04-24 ENCOUNTER — Ambulatory Visit (INDEPENDENT_AMBULATORY_CARE_PROVIDER_SITE_OTHER): Payer: 59 | Admitting: Gynecology

## 2013-04-24 VITALS — BP 110/66 | HR 64 | Ht 68.0 in | Wt 188.0 lb

## 2013-04-24 DIAGNOSIS — N820 Vesicovaginal fistula: Secondary | ICD-10-CM

## 2013-04-24 DIAGNOSIS — Z01419 Encounter for gynecological examination (general) (routine) without abnormal findings: Secondary | ICD-10-CM

## 2013-04-24 DIAGNOSIS — N821 Other female urinary-genital tract fistulae: Secondary | ICD-10-CM

## 2013-04-24 MED ORDER — UROGESIC-BLUE 81.6 MG PO TABS: 1.0000 | ORAL_TABLET | ORAL | Status: DC

## 2013-04-24 NOTE — Progress Notes (Signed)
52 y.o.   Divorced    [place]   female  G1P1  here for annual exam.  TAH done 2006 for fibroids, went back for BSO in 2008, pt did not go on ERT. Pt reports dryness with sex, uses [place] Jelly with good results.  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 Patient has had a hysterectomy.          Sexually active: yes  The current method of family planning is none.    Exercising: walk Last mammogram: 02/2013   Last pap smear: 2012 History of abnormal pap: no  Smoking: never Alcohol: 4-5 glasses of wine weekly Last colonoscopy: 17 years ago, normal Last Bone Density:  ? 6 years ago, normal Last tetanus shot: UTD Last cholesterol check: 2 years ago   Hgb:  PCP           Urine: PCP   Family History  Problem Relation Age of Onset  . Heart disease Father     Patient Active Problem List   Diagnosis Date Noted  . Multiple myeloma 10/05/2011    Past Medical History  Diagnosis Date  . Anemia   . Leukopenia 2001  . Monoclonal gammopathy 06/2011    Past Surgical History  Procedure Laterality Date  . History c-section    . Abdominal hysterectomy    . Bi-lateral salpingo oophorec  2008    Allergies: Review of patient's allergies indicates no known allergies.  Current Outpatient Prescriptions  Medication Sig Dispense Refill  . ferrous sulfate 325 (65 FE) MG tablet Take 325 mg by mouth daily.        . Vitamin D, Ergocalciferol, (DRISDOL) 50000 UNITS CAPS Take 50,000 Units by mouth once a week.         No current facility-administered medications for this visit.    ROS: Pertinent items are noted in HPI.  Social Hx:    Exam:    BP 110/66   Pulse 64  Ht 5\' 8"  (1.727 m)  Wt 188 lb (85.276 kg)  BMI 28.59 kg/m2   Wt Readings from Last 3 Encounters:  04/24/13 188 lb (85.276 kg)  10/05/11 175 lb 6.4 oz (79.561 kg)  08/25/11 178 lb 3.2 oz (80.831 kg)     Ht Readings from Last 3 Encounters:  04/24/13 5\' 8"  (1.727 m)  10/05/11 5\' 8"  (1.727 m)  08/25/11 5\' 8"  (1.727 m)    General appearance: alert, cooperative and appears stated age Head: Normocephalic, without obvious abnormality, atraumatic Neck: no adenopathy, supple, symmetrical, trachea midline and thyroid not enlarged, symmetric, no tenderness/mass/nodules Lungs: clear to auscultation bilaterally Breasts: Inspection negative, No nipple retraction or dimpling, No nipple discharge or bleeding, No axillary or supraclavicular adenopathy, Normal to palpation without dominant masses, bilateral axillary breast tissue Heart: regular rate and rhythm Abdomen: soft, non-tender; bowel sounds normal; no masses,  no organomegaly Extremities: extremities normal, atraumatic, no cyanosis or edema Skin: Skin color, texture, turgor normal. No rashes or lesions Lymph nodes: Cervical, supraclavicular, and axillary nodes normal. No abnormal inguinal nodes palpated Neurologic: Grossly normal   Pelvic: External genitalia:  no lesions  Urethra:  normal appearing urethra with no masses, tenderness or lesions              Bartholins and Skenes: normal                 Vagina: normal appearing vagina with normal color, thin clear discharge high in vault              Cervix: normal appearance              Pap taken: no        Bimanual Exam:  Uterus:  absent                                      Adnexa: absent                                      Rectovaginal: Confirms                                      Anus:  normal sphincter tone, no lesions  A: normal menopausal exam 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

## 2013-04-24 NOTE — Patient Instructions (Addendum)

## 2013-08-05 ENCOUNTER — Other Ambulatory Visit: Payer: Self-pay | Admitting: Medical Oncology

## 2013-08-06 ENCOUNTER — Other Ambulatory Visit: Payer: Self-pay | Admitting: Medical Oncology

## 2013-08-06 ENCOUNTER — Telehealth: Payer: Self-pay | Admitting: Internal Medicine

## 2013-08-06 DIAGNOSIS — C9000 Multiple myeloma not having achieved remission: Secondary | ICD-10-CM

## 2013-08-06 NOTE — Telephone Encounter (Signed)
, °

## 2013-08-27 ENCOUNTER — Other Ambulatory Visit: Payer: Self-pay | Admitting: Family Medicine

## 2013-08-27 DIAGNOSIS — N63 Unspecified lump in unspecified breast: Secondary | ICD-10-CM

## 2013-09-09 ENCOUNTER — Telehealth: Payer: Self-pay | Admitting: Internal Medicine

## 2013-09-09 NOTE — Telephone Encounter (Signed)
Pt called to r/s 10/15 appt to 10/22. Pt also inquired about seeing BS and was informed that BS is not accepting new/addional pt's at this time. Pt has new d/t for 10/22 lb/CP1 @ 1:30pm.

## 2013-09-10 ENCOUNTER — Ambulatory Visit
Admission: RE | Admit: 2013-09-10 | Discharge: 2013-09-10 | Disposition: A | Discharge status: Home / Self Care | Payer: 59 | Source: Ambulatory Visit | Attending: Family Medicine | Admitting: Family Medicine

## 2013-09-10 ENCOUNTER — Other Ambulatory Visit: Payer: Self-pay | Admitting: Family Medicine

## 2013-09-10 ENCOUNTER — Other Ambulatory Visit: Payer: 59 | Admitting: Lab

## 2013-09-10 ENCOUNTER — Ambulatory Visit: Payer: 59

## 2013-09-10 DIAGNOSIS — N63 Unspecified lump in unspecified breast: Secondary | ICD-10-CM

## 2013-09-17 ENCOUNTER — Other Ambulatory Visit (HOSPITAL_BASED_OUTPATIENT_CLINIC_OR_DEPARTMENT_OTHER): Payer: 59 | Admitting: Lab

## 2013-09-17 ENCOUNTER — Ambulatory Visit (HOSPITAL_COMMUNITY)
Admission: RE | Admit: 2013-09-17 | Discharge: 2013-09-17 | Disposition: A | Discharge status: Home / Self Care | Payer: 59 | Source: Ambulatory Visit | Attending: Internal Medicine | Admitting: Internal Medicine

## 2013-09-17 ENCOUNTER — Other Ambulatory Visit: Payer: Self-pay | Admitting: Medical Oncology

## 2013-09-17 ENCOUNTER — Ambulatory Visit (HOSPITAL_BASED_OUTPATIENT_CLINIC_OR_DEPARTMENT_OTHER): Payer: 59 | Admitting: Internal Medicine

## 2013-09-17 ENCOUNTER — Telehealth: Payer: Self-pay | Admitting: *Deleted

## 2013-09-17 VITALS — BP 144/87 | HR 91 | Temp 97.6°F | Resp 20 | Ht 68.0 in | Wt 181.7 lb

## 2013-09-17 DIAGNOSIS — C9000 Multiple myeloma not having achieved remission: Secondary | ICD-10-CM

## 2013-09-17 DIAGNOSIS — N63 Unspecified lump in unspecified breast: Secondary | ICD-10-CM

## 2013-09-17 DIAGNOSIS — D649 Anemia, unspecified: Secondary | ICD-10-CM

## 2013-09-17 DIAGNOSIS — D72819 Decreased white blood cell count, unspecified: Secondary | ICD-10-CM

## 2013-09-17 LAB — CBC WITH DIFFERENTIAL/PLATELET
Basophils Absolute: 0 10*3/uL (ref 0.0–0.1)
EOS%: 0.2 % (ref 0.0–7.0)
HCT: 33.5 % — ABNORMAL LOW (ref 34.8–46.6)
HGB: 11 g/dL — ABNORMAL LOW (ref 11.6–15.9)
LYMPH%: 37.7 % (ref 14.0–49.7)
MCH: 29 pg (ref 25.1–34.0)
MCV: 88.3 fL (ref 79.5–101.0)
MONO%: 9.2 % (ref 0.0–14.0)
NEUT#: 2 10*3/uL (ref 1.5–6.5)
NEUT%: 52.3 % (ref 38.4–76.8)
Platelets: 236 10*3/uL (ref 145–400)

## 2013-09-17 LAB — COMPREHENSIVE METABOLIC PANEL (CC13)
AST: 15 U/L (ref 5–34)
Alkaline Phosphatase: 58 U/L (ref 40–150)
BUN: 18.5 mg/dL (ref 7.0–26.0)
Creatinine: 0.8 mg/dL (ref 0.6–1.1)
Total Bilirubin: 0.31 mg/dL (ref 0.20–1.20)

## 2013-09-17 NOTE — Patient Instructions (Signed)
(Smothering ) Multiple Myeloma Multiple myeloma is the most common cancer of bone. It is caused by the uncontrolled multiplication of a type of white blood cell in the marrow. This white blood cell is called a plasma cell. This means the bone marrow is overworking producing plasma cells. Soon these overproduced cells begin to take up room in the marrow that is needed by other cells. This means that there are soon not enough red or white blood cells or platelets. Not enough red cells mean that the person is anemic. There are not enough red blood cells to carry oxygen around the body. There are not enough white blood cells to fight disease. This causes the person with multiple myeloma to not feel well. There is also bone pain through much of the body. SYMPTOMS  Anemia causes fatigue (tiredness) and weakness.  Back pain is common. This is from fractures (break in bones) caused by damage to the bones of the back.  Lack of white blood cells makes infection more likely.  Bleeding is a common problem from lack of the cells (platelets). Platelets help blood clots form. This may show up as bleeding from any place. Commonly this shows up as bleeding from the nose or gums.  Fractures (bone breaks) are more common anywhere. The back and ribs are the most commonly fractured areas. DIAGNOSIS  This tumor is often suggested by blood tests. Often doing a bone marrow sample makes the diagnosis (learning what is wrong). This is a test performed by taking a small sample of bone with a small needle. This bone often comes from the sternum (breast bone). This sample is sent to a pathologist (a specialist in looking at tissue under a microscope). After looking at the sample under the microscope, the pathologist is able to make a diagnosis of the problem. X-rays may also show boney changes. TREATMENT   Occasionally, anti-cancer medications may be used with multiple myeloma. Your caregiver can discuss this with  you.  Medications can also be given to help with the bone pain.  There is no cure for multiple myeloma. Lifestyle changes can add years of quality living. HOME CARE INSTRUCTIONS  Often there is no specific treatment for multiple myeloma. Most of the treatment consists of adjustments in dietary and living activities. Some of these changes include:  Your dietitian or caregiver helping you with your dietary questions.  Taking iron and vitamins as prescribed by your caregiver.  Eating a well balanced diet.  Staying active, but follow restrictions suggested by your caregiver. Avoiding heavy lifting (more than 10 pounds) and activities that cause increased pain.  Drinking plenty of water.  Using back braces and a cane may help with some of the boney pain. SEEK IMMEDIATE MEDICAL CARE IF:  You develop severe, uncontrolled boney pain.  You or your family notices confusion, problems with decision-making or inability to stay awake.  You notice increased urination or constipation.  You notice problems holding your water or stool.  You have numbness or loss of control of your extremities (arms/hands or legs/feet). Document Released: 08/08/2001 Document Revised: 02/05/2012 Document Reviewed: 11/08/2008 [facility] Patient Information 2014 [place], [place]. Bone Scan Your caregiver has requested that you have a bone scan. In this test a very small amount of radioactive material is injected into a vein (intravenously). This radioactive material targets your bones, but the highest concentration occurs at areas of:  Injury.  Disease. Your bones can then be scanned by a machine that detects radioactive material. The scanning  is painless. The scan can detect bone damage from various causes and will sometimes detect very small defects in bones that ordinary X-rays cannot. It can take up to 4 hours to finish a scan. Common uses for this exam include looking for:  Low bone density (osteopenia).  Not  enough new bone forming or too much old bone being reabsorbed by the body (osteoporosis). LET YOUR CAREGIVERS KNOW ABOUT:  Allergies.  Medications taken including herbs, eye drops, over-the-counter medications, and creams.  Use of steroids (by mouth or creams).  Previous problems with anesthetics.  Possibility of pregnancy, if this applies.  History of blood clots (thrombophlebitis).  History of bleeding or blood problems.  Previous surgery.  Other health problems. PREPARATION  Drink extra fluids for 24 hours prior to the exam.  Do not have exams using barium or X-ray contrast medium for 48 hours before the exam.  Continue with normal routines regarding food and medications, or as told by your caregiver.  Do not wear jewelry or clothes with metal to the exam. BEFORE THE PROCEDURE You should be present 60 minutes prior to your procedure or as directed.  PROCEDURE   Radioactive material is injected intravenously over approximately 10 minutes.  There generally are no side effects from the injection.  Depending on the type of exam, images are taken at varying time intervals for up to 4 hours. Your technologist performing the test will let you know what time intervals will be used for your test. You may go on with normal activities in between scans. DURING THE WAITING PERIODS  Continue drinking extra fluids.  Empty your bladder often as this eliminates the radioactive material that your bones do not use.  Eat and take medications normally during the test. AFTER THE PROCEDURE  You may return to all normal activities.  All of the radioactive material will eventually leave your body through your urine. The radioactive material used is in such a small amount you should have no long-term effect from this test.  The radioactive material that you will be peeing out will not be harmful for your family or other people around you. Your scan will be interpreted by a specialist in  reading X-rays (radiologist) with special training in nuclear medicine. He or she will send a report of this scan to your primary caregiver. It may take a couple days for your primary caregiver to receive and review these results. Find out how you are to receive your results, or call for your results as instructed by your caregiver. Remember, it is your responsibility to obtain the results of your procedure. Do not assume everything is fine because you do not hear from your caregiver. Document Released: 11/10/2000 Document Revised: 02/05/2012 Document Reviewed: 01/11/2009 [facility] Patient Information 2014 [place], [place].

## 2013-09-17 NOTE — Telephone Encounter (Signed)
appts made and printed...td 

## 2013-09-18 ENCOUNTER — Telehealth: Payer: Self-pay | Admitting: Internal Medicine

## 2013-09-18 LAB — IGG, IGA, IGM
IgA: 107 mg/dL (ref 69–380)
IgG (Immunoglobin G), Serum: 2060 mg/dL — ABNORMAL HIGH (ref 690–1700)
IgM, Serum: 4 mg/dL — ABNORMAL LOW (ref 52–322)

## 2013-09-18 LAB — KAPPA/LAMBDA LIGHT CHAINS
Kappa:Lambda Ratio: 1.12 (ref 0.26–1.65)
Lambda Free Lght Chn: 1.36 mg/dL (ref 0.57–2.63)

## 2013-09-18 NOTE — Telephone Encounter (Signed)
I spoke personally with patient on 09/18/2013 at 5:30 pm to review her smothering MM labs.  They remain stable.  In addition, her scan did not show any new lytic lesions. When compared to prior scans.  MRI confirmed negative lesions 2 years ago.   She will sign up to My Chart.  Myra Rude, MD

## 2013-09-19 DIAGNOSIS — D649 Anemia, unspecified: Secondary | ICD-10-CM | POA: Insufficient documentation

## 2013-09-19 DIAGNOSIS — D72819 Decreased white blood cell count, unspecified: Secondary | ICD-10-CM | POA: Insufficient documentation

## 2013-09-19 NOTE — Progress Notes (Signed)
[facility] OFFICE PROGRESS NOTE  [name], MD [address] [place] [address]  DIAGNOSIS: Smothering Multiple myeloma - Plan: CBC with Differential, Comprehensive metabolic panel, Kappa/lambda light chains, SPEP, Protein electrophoresis, serum, UIFE/TP, DG Bone Survey Met  Leukocytopenia, unspecified  Anemia, unspecified  Chief Complaint  Patient presents with  . Smothering Multiple myeloma    CURRENT THERAPY:  INTERVAL HISTORY: [name] 52 y.o. female with a history of smothering Multiple myeloma is here for follow-up.  She was last seen by Dr.  Kimberlee Nearing nearly two years ago on 10/05/2011.  She reports that she had lost her insurance coverage and was unable to follow-up as recommended.  In interim, she denies any fevers, chills, night sweats or weight lost.  She also denies any bone aches or pains.  She continues to exercises at least three days a week with squats causing some discomfort to her knees.  Otherwise, she denies any recent hospitalizations.   MEDICAL HISTORY: Past Medical History  Diagnosis Date  . Anemia   . Leukopenia 2001  . Monoclonal gammopathy 06/2011    INTERIM HISTORY: has Multiple myeloma; Leukocytopenia, unspecified; and Anemia, unspecified on her problem list.    ALLERGIES:  has No Known Allergies.  MEDICATIONS: has a current medication list which includes the following prescription(s): ferrous sulfate.  SURGICAL HISTORY:  Past Surgical History  Procedure Laterality Date  . History c-section    . Abdominal hysterectomy    . Bi-lateral salpingo oophorec  2008    REVIEW OF SYSTEMS:   Constitutional: Denies fevers, chills or abnormal weight loss Eyes: Denies blurriness of vision Ears, nose, mouth, throat, and face: Denies mucositis or sore throat Respiratory: Denies cough, dyspnea or wheezes Cardiovascular: Denies palpitation, chest discomfort or lower extremity  swelling Gastrointestinal:  Denies nausea, heartburn or change in bowel habits Skin: Denies abnormal skin rashes Lymphatics: Denies new lymphadenopathy or easy bruising Neurological:Denies numbness, tingling or new weaknesses Behavioral/Psych: Mood is stable, no new changes  All other systems were reviewed with the patient and are negative.  PHYSICAL EXAMINATION: ECOG PERFORMANCE STATUS: 0 - Asymptomatic  Blood pressure 144/87, pulse 91, temperature 97.6 F (36.4 C), temperature source Oral, resp. rate 20, height 5\' 8"  (1.727 m), weight 181 lb 11.2 oz (82.419 kg).  GENERAL:alert, no distress and comfortable; well developed and well-nourished.  Anxious. SKIN: skin color, texture, turgor are normal, no rashes or significant lesions EYES: normal, Conjunctiva are pink and non-injected, sclera clear OROPHARYNX:no exudate, no erythema and lips, buccal mucosa, and tongue normal  NECK: supple, thyroid normal size, non-tender, without nodularity LYMPH:  no palpable lymphadenopathy in the cervical, axillary or supraclavicular LUNGS: clear to auscultation and percussion with normal breathing effort HEART: regular rate & rhythm and no murmurs and no lower extremity edema ABDOMEN:abdomen soft, non-tender and normal bowel sounds Musculoskeletal:no cyanosis of digits and no clubbing  NEURO: alert & oriented x 3 with fluent speech, no focal motor/sensory deficits   LABORATORY DATA: Results for orders placed in visit on 09/17/13 (from the past 48 hour(s))  CBC WITH DIFFERENTIAL     Status: Abnormal   Collection Time    09/17/13  1:25 PM      Result Value Range   WBC 3.7 (*) 3.9 - 10.3 10e3/uL   NEUT# 2.0  1.5 - 6.5 10e3/uL   HGB 11.0 (*) 11.6 - 15.9 g/dL   HCT 62.9 (*) 52.8 - 41.3 %   Platelets 236  145 - 400  10e3/uL   MCV 88.3  79.5 - 101.0 fL   MCH 29.0  25.1 - 34.0 pg   MCHC 32.8  31.5 - 36.0 g/dL   RBC 0.98  1.19 - 1.47 10e6/uL   RDW 14.5  11.2 - 14.5 %   lymph# 1.4  0.9 - 3.3 10e3/uL    MONO# 0.3  0.1 - 0.9 10e3/uL   Eosinophils Absolute 0.0  0.0 - 0.5 10e3/uL   Basophils Absolute 0.0  0.0 - 0.1 10e3/uL   NEUT% 52.3  38.4 - 76.8 %   LYMPH% 37.7  14.0 - 49.7 %   MONO% 9.2  0.0 - 14.0 %   EOS% 0.2  0.0 - 7.0 %   BASO% 0.6  0.0 - 2.0 %  COMPREHENSIVE METABOLIC PANEL (CC13)     Status: None   Collection Time    09/17/13  1:26 PM      Result Value Range   Sodium 138  136 - 145 mEq/L   Potassium 4.0  3.5 - 5.1 mEq/L   Chloride 106  98 - 109 mEq/L   CO2 24  22 - 29 mEq/L   Glucose 98  70 - 140 mg/dl   BUN 82.9  7.0 - 56.2 mg/dL   Creatinine 0.8  0.6 - 1.1 mg/dL   Total Bilirubin 1.30  0.20 - 1.20 mg/dL   Alkaline Phosphatase 58  40 - 150 U/L   AST 15  5 - 34 U/L   ALT 13  0 - 55 U/L   Total Protein 8.3  6.4 - 8.3 g/dL   Albumin 3.8  3.5 - 5.0 g/dL   Calcium 9.2  8.4 - 86.5 mg/dL   Anion Gap 9  3 - 11 mEq/L  KAPPA/LAMBDA LIGHT CHAINS     Status: None   Collection Time    09/17/13  1:26 PM      Result Value Range   Kappa free light chain 1.52  0.33 - 1.94 mg/dL   Lambda Free Lght Chn 1.36  0.57 - 2.63 mg/dL   Kappa:Lambda Ratio 7.84  0.26 - 1.65  IGG, IGA, IGM     Status: Abnormal   Collection Time    09/17/13  1:26 PM      Result Value Range   IgG (Immunoglobin G), Serum 2060 (*) 690 - 1700 mg/dL   IgA 696  69 - 295 mg/dL   IgM, Serum 4 (*) 52 - 322 mg/dL    Labs:  Lab Results  Component Value Date   WBC 3.7* 09/17/2013   HGB 11.0* 09/17/2013   HCT 33.5* 09/17/2013   MCV 88.3 09/17/2013   PLT 236 09/17/2013   NEUTROABS 2.0 09/17/2013      Chemistry      Component Value Date/Time   NA 138 09/17/2013 1326   NA 140 10/05/2011 1549   K 4.0 09/17/2013 1326   K 4.3 10/05/2011 1549   CL 105 10/05/2011 1549   CO2 24 09/17/2013 1326   CO2 27 10/05/2011 1549   BUN 18.5 09/17/2013 1326   BUN 14 10/05/2011 1549   CREATININE 0.8 09/17/2013 1326   CREATININE 0.63 10/05/2011 1549      Component Value Date/Time   CALCIUM 9.2 09/17/2013 1326   CALCIUM 9.5  10/05/2011 1549   ALKPHOS 58 09/17/2013 1326   ALKPHOS 61 10/05/2011 1549   AST 15 09/17/2013 1326   AST 14 10/05/2011 1549   ALT 13 09/17/2013 1326   ALT 12 10/05/2011 1549  BILITOT 0.31 09/17/2013 1326   BILITOT 0.1* 10/05/2011 1549      Basic Metabolic Panel:  Recent Labs Lab 09/17/13 1326  NA 138  K 4.0  CO2 24  GLUCOSE 98  BUN 18.5  CREATININE 0.8  CALCIUM 9.2   GFR Estimated Creatinine Clearance: 91.5 ml/min (by C-G formula based on Cr of 0.8). Liver Function Tests:  Recent Labs Lab 09/17/13 1326  AST 15  ALT 13  ALKPHOS 58  BILITOT 0.31  PROT 8.3  ALBUMIN 3.8   CBC:  Recent Labs Lab 09/17/13 1325  WBC 3.7*  NEUTROABS 2.0  HGB 11.0*  HCT 33.5*  MCV 88.3  PLT 236   Results for CHELLIE, VANLUE (MRN [id number]) as of 09/19/2013 08:27  Ref. Range 10/05/2011 15:49 11/02/2011 16:20 09/17/2013 13:26  IgG (Immunoglobin G), Serum Latest Range: 438-328-2623 mg/dL 7846 (H) 9629 (H) 5284 (H)  IgA Latest Range: 69-380 mg/dL 132 440 102  IgM, Serum Latest Range: 52-322 mg/dL 5 (L) 85 4 (L)  Kappa free light chain Latest Range: 0.33-1.94 mg/dL 7.25  3.66  Lambda Free Lght Chn Latest Range: 0.57-2.63 mg/dL 4.40  3.47  Kappa:Lambda Ratio Latest Range: 0.26-1.65  2.83 (H)  1.12   Results for KAMYIA, THOMASON (MRN [id number]) as of 09/19/2013 08:27  Ref. Range 08/01/2011 08:44  Time-UPE24 No range found 24  Volume, Urine-UPE24 No range found 900  Total Protein, Urine-UPE24 No range found 3.5  Total Protein, Urine-Ur/day Latest Range: 10-140 mg/day 32  ALBUMIN, U Latest Range: DETECTED  DETECTED  Alpha 1, Urine Latest Range: NONE DET  DETECTED (A)  Alpha 2, Urine Latest Range: NONE DET  DETECTED (A)  Beta, Urine Latest Range: NONE DET  DETECTED (A)  Gamma Globulin, Urine Latest Range: NONE DET  DETECTED (A)  Free Kappa Lt Chains,Ur Latest Range: 0.14-2.42 mg/dL 5.64 (H)  Free Lt Chn Excr Rate No range found 23.76  Free Lambda Lt Chains,Ur Latest Range: 0.02-0.67  mg/dL 3.32  Free Lambda Excretion/Day No range found 0.63  Free Kappa/Lambda Ratio Latest Range: 2.04-10.37 ratio 37.71 (H)   Studies:  Dg Bone Survey Met  09/17/2013   CLINICAL DATA:  Multiple myeloma.  EXAM: METASTATIC BONE SURVEY  COMPARISON:  08/16/2011  FINDINGS: Degenerative changes in the cervical, thoracic and upper lumbar spine. Again noted is the solitary lytic lesion within the left inferior pubic ramus, unchanged. No additional suspicious focal lytic lesions. Heart is upper limits normal in size. Lungs are clear. No effusions.  IMPRESSION: Solitary focal lytic lesion within the left inferior pubic ramus, unchanged.   Electronically Signed   By: Charlett Nose M.D.   On: 09/17/2013 18:31   RADIOGRAPHIC STUDIES: MR Pelvis W Wo Contrast (09/01/2011)  IMPRESSION:  1. The abnormality in the left ischium on prior osseous survey is artifactual. There is no osseous lesion or marrow replacement in this region on MRI.  2. Mild edema and enhancement in the medial left iliac bone,  compatible with recent bone marrow biopsy.  US Breast Bilateral 09/10/2013   CLINICAL DATA:  Followup right breast mass. Left axillary lump, felt to be a lipoma by her physician.  EXAM: DIGITAL DIAGNOSTIC UNILATERAL RIGHT MAMMOGRAM; BILATERAL BREAST ULTRASOUND  COMPARISON:  Multiple priors  ACR Breast Density Category b: There are scattered areas of fibroglandular density.  FINDINGS: The previously seen mass in the upper central right breast is less apparent mammographically than on the prior examination.  Mammographic images were processed with CAD.  Targeted physical examination of  the upper central right breast is negative. Just lateral to the left axilla, there is a soft mass.  Targeted ultrasound of the right breast demonstrates a 4 x 3 x 5 mm predominately anechoic mass at 12 o'clock, 2 cm from the nipple. This has decreased in size from the previous examination in which it measured 6 x 9 x 9 mm, indicating  benignity. Just lateral to the left axilla, in the area of palpable concern, fatty tissue is identified sonographically.  IMPRESSION: 1. The previously seen right breast mass has decreased in size since the prior examination, confirming the suspicion that this represents a cluster of microcysts. 2. Palpable abnormality lateral to the left axilla, with ultrasound findings consistent with a lipoma.  RECOMMENDATION: Bilateral screening mammogram in March 2015. Further management of the patient's palpable abnormality should be on a clinical basis.  I have discussed the findings and recommendations with the patient. Results were also provided in writing at the conclusion of the visit. If applicable, a reminder letter will be sent to the patient regarding the next appointment.  BI-RADS CATEGORY  2: Benign Finding(s)   Electronically Signed   By: Jerene Dilling M.D.   On: 09/10/2013 11:40   Dg Bone Survey Met  09/17/2013   CLINICAL DATA:  Multiple myeloma.  EXAM: METASTATIC BONE SURVEY  COMPARISON:  08/16/2011  FINDINGS: Degenerative changes in the cervical, thoracic and upper lumbar spine. Again noted is the solitary lytic lesion within the left inferior pubic ramus, unchanged. No additional suspicious focal lytic lesions. Heart is upper limits normal in size. Lungs are clear. No effusions.  IMPRESSION: Solitary focal lytic lesion within the left inferior pubic ramus, unchanged.   Electronically Signed   By: Charlett Nose M.D.   On: 09/17/2013 18:31   Mm Digital Diagnostic Unilat R  09/10/2013   CLINICAL DATA:  Followup right breast mass. Left axillary lump, felt to be a lipoma by her physician.  EXAM: DIGITAL DIAGNOSTIC UNILATERAL RIGHT MAMMOGRAM; BILATERAL BREAST ULTRASOUND  COMPARISON:  Multiple priors  ACR Breast Density Category b: There are scattered areas of fibroglandular density.  FINDINGS: The previously seen mass in the upper central right breast is less apparent mammographically than on the prior  examination.  Mammographic images were processed with CAD.  Targeted physical examination of the upper central right breast is negative. Just lateral to the left axilla, there is a soft mass.  Targeted ultrasound of the right breast demonstrates a 4 x 3 x 5 mm predominately anechoic mass at 12 o'clock, 2 cm from the nipple. This has decreased in size from the previous examination in which it measured 6 x 9 x 9 mm, indicating benignity. Just lateral to the left axilla, in the area of palpable concern, fatty tissue is identified sonographically.  IMPRESSION: 1. The previously seen right breast mass has decreased in size since the prior examination, confirming the suspicion that this represents a cluster of microcysts. 2. Palpable abnormality lateral to the left axilla, with ultrasound findings consistent with a lipoma.  RECOMMENDATION: Bilateral screening mammogram in March 2015. Further management of the patient's palpable abnormality should be on a clinical basis.  I have discussed the findings and recommendations with the patient. Results were also provided in writing at the conclusion of the visit. If applicable, a reminder letter will be sent to the patient regarding the next appointment.  BI-RADS CATEGORY  2: Benign Finding(s)   Electronically Signed   By: Jerene Dilling M.D.   On: 09/10/2013  11:40   BONE MARROW BIOPSY (08/11/2011) Bone Marrow, Aspirate,Biopsy, and Clot - PLASMA CELL DYSCRASIA, PLEASE SEE COMMENT. - VARIABLY CELLULAR MARROW WITH TRILINEAGE HEMATOPOIESIS. PLEASE SEE COMMENT. PERIPHERAL BLOOD: - MICROCYTIC ANEMIA Bone Marrow count performed on 500 cells shows: Blasts: 0% Myeloid 49% Promyelocyts: 1% Myelocytes: 5% Erythroid 27% Metamyelocyts: 8% Bands: 12% Lymphocytes: 11% Neutrophils: 16% Eosinophils: 7% Plasma Cells: 12% Basophils: 0% Monocytes: 1% M:E ratio: 1.8:1   ASSESSMENT: Pearlene M [age] y.o. female with a history of Smothering Multiple myeloma - Plan: CBC  with Differential, Comprehensive metabolic panel, Kappa/lambda light chains, SPEP, Protein electrophoresis, serum, UIFE/TP, DG Bone Survey Met  Leukocytopenia, unspecified  Anemia, unspecified  ASSESSMENT & PLAN: Patient is an 52 yo female with  diagnosed smothering IgG kappa Multiple myeloma complicated by mild  normocytic anemia. Based on NCCN guidelines defining end organ damage with creatinine greater than 2 or a hemoglobin less than 10, she would be considered as smothering Multiple myeloma. She would be  a candidate for transplant based on his young age and the absence of  co-morbidities.   1. Smothering IgG kappa multiple myeloma, with bone marrow aspirate and biopsy carried out on 08/12/2013 showing 12% plasma cells. IgG level on 09/17/2013 is 2060 down from 2680 on 11/02/2011.  Kappa Lambda ratio of 1.12 down from 2.83 in 2012.  Total protein 8.3. Calcium of 9.2.  Metastatic bone survey followed by MRI of carried out on 09/01/2011 was negative for evidence of multiple myeloma. A 24-hour urine protein collected on 01/29/2013 was 41.5 mg. Urine immunofixation on 08/01/2011 showed a monoclonal IgG heavy chain with associated kappa light chain electrophoresis an excess monoclonal free lambda light chains.  --We ordered her annual skeletal survey which is as noted above.  Stable, without new lesions.   The prior lesion was investigated with an MRI and it was not a lytic lesion but an artifact.    2. Normomyctic normochromic mildly iron-defiency anemia likely secondary to #1 versus iron-defiency. Her bone marrow biopsy 2 years ago demonstrated low iron stores.  Anemia with hemoglobin 11.0 up from 10.2 and 9.7 in 10/05/2011. Overall trend is increasing.  She was on iron in the past.   Her prior CBC did demonstrate an increased RDW.   3. Leukopenia.   Her WBC is 3.7 on 09/17/2013 stable from her WBC of 3.8 on 11/02/2011.   She denies any recurrent infections.  Her ANC is 2.   4. Benign right breast  mass --Bilateral screening mammogram in March 2015 based on her recent mammogram. Further management of the patient's palpable abnormality should be on a clinical basis.  5. Follow-up. -- For now, we will continue surveillance every 3 months with labs including CBC, CMP, SPEP and kappa/lamba light chains and urine immunofixation studies.   All questions were answered. The patient knows to call the clinic with any problems, questions or concerns. We can certainly see the patient much sooner if necessary.  I spent 15 minutes counseling the patient face to face. The total time spent in the appointment was 25 minutes.    Danielle Lybarger, MD 09/19/2013 8:43 AM

## 2013-12-16 ENCOUNTER — Ambulatory Visit (HOSPITAL_BASED_OUTPATIENT_CLINIC_OR_DEPARTMENT_OTHER): Payer: 59

## 2013-12-16 DIAGNOSIS — C9000 Multiple myeloma not having achieved remission: Secondary | ICD-10-CM

## 2013-12-16 LAB — CBC WITH DIFFERENTIAL/PLATELET
BASO%: 0.4 % (ref 0.0–2.0)
Basophils Absolute: 0 10*3/uL (ref 0.0–0.1)
EOS ABS: 0 10*3/uL (ref 0.0–0.5)
EOS%: 0.4 % (ref 0.0–7.0)
HCT: 34.2 % — ABNORMAL LOW (ref 34.8–46.6)
HGB: 11.2 g/dL — ABNORMAL LOW (ref 11.6–15.9)
LYMPH%: 46.9 % (ref 14.0–49.7)
MCH: 29.1 pg (ref 25.1–34.0)
MCHC: 32.7 g/dL (ref 31.5–36.0)
MCV: 88.8 fL (ref 79.5–101.0)
MONO#: 0.3 10*3/uL (ref 0.1–0.9)
MONO%: 11.2 % (ref 0.0–14.0)
NEUT%: 41.1 % (ref 38.4–76.8)
NEUTROS ABS: 1.1 10*3/uL — AB (ref 1.5–6.5)
PLATELETS: 224 10*3/uL (ref 145–400)
RBC: 3.85 10*6/uL (ref 3.70–5.45)
RDW: 13.4 % (ref 11.2–14.5)
WBC: 2.6 10*3/uL — ABNORMAL LOW (ref 3.9–10.3)
lymph#: 1.2 10*3/uL (ref 0.9–3.3)

## 2013-12-16 LAB — COMPREHENSIVE METABOLIC PANEL (CC13)
ALBUMIN: 4.1 g/dL (ref 3.5–5.0)
ALK PHOS: 50 U/L (ref 40–150)
ALT: 12 U/L (ref 0–55)
AST: 17 U/L (ref 5–34)
Anion Gap: 9 mEq/L (ref 3–11)
BUN: 13.2 mg/dL (ref 7.0–26.0)
CO2: 27 mEq/L (ref 22–29)
CREATININE: 0.7 mg/dL (ref 0.6–1.1)
Calcium: 9.4 mg/dL (ref 8.4–10.4)
Chloride: 104 mEq/L (ref 98–109)
GLUCOSE: 94 mg/dL (ref 70–140)
POTASSIUM: 4.1 meq/L (ref 3.5–5.1)
Sodium: 140 mEq/L (ref 136–145)
Total Bilirubin: 0.34 mg/dL (ref 0.20–1.20)
Total Protein: 8.2 g/dL (ref 6.4–8.3)

## 2013-12-18 LAB — PROTEIN ELECTROPHORESIS, SERUM
ALBUMIN ELP: 54.5 % — AB (ref 55.8–66.1)
Alpha-1-Globulin: 2.8 % — ABNORMAL LOW (ref 2.9–4.9)
Alpha-2-Globulin: 9.2 % (ref 7.1–11.8)
BETA 2: 2.8 % — AB (ref 3.2–6.5)
Beta Globulin: 4.6 % — ABNORMAL LOW (ref 4.7–7.2)
Gamma Globulin: 26.1 % — ABNORMAL HIGH (ref 11.1–18.8)
M-Spike, %: 1.64 g/dL
Total Protein, Serum Electrophoresis: 7.8 g/dL (ref 6.0–8.3)

## 2013-12-18 LAB — KAPPA/LAMBDA LIGHT CHAINS
KAPPA FREE LGHT CHN: 1.28 mg/dL (ref 0.33–1.94)
Kappa:Lambda Ratio: 1.16 (ref 0.26–1.65)
LAMBDA FREE LGHT CHN: 1.1 mg/dL (ref 0.57–2.63)

## 2013-12-19 ENCOUNTER — Ambulatory Visit (HOSPITAL_BASED_OUTPATIENT_CLINIC_OR_DEPARTMENT_OTHER): Payer: 59 | Admitting: Internal Medicine

## 2013-12-19 VITALS — BP 135/84 | HR 99 | Temp 97.9°F | Resp 20 | Ht 68.0 in | Wt 186.0 lb

## 2013-12-19 DIAGNOSIS — D649 Anemia, unspecified: Secondary | ICD-10-CM

## 2013-12-19 DIAGNOSIS — N63 Unspecified lump in unspecified breast: Secondary | ICD-10-CM

## 2013-12-19 DIAGNOSIS — C9000 Multiple myeloma not having achieved remission: Secondary | ICD-10-CM

## 2013-12-19 DIAGNOSIS — D72819 Decreased white blood cell count, unspecified: Secondary | ICD-10-CM

## 2013-12-19 NOTE — Patient Instructions (Signed)
Neutropenia Neutropenia is a condition that occurs when the level of a certain type of white blood cell (neutrophil) in your body becomes lower than normal. Neutrophils are made in the bone marrow and fight infections. These cells protect against bacteria and viruses. The fewer neutrophils you have, and the longer your body remains without them, the greater your risk of getting a severe infection becomes. CAUSES  The cause of neutropenia may be hard to determine. However, it is usually due to 3 main problems:   Decreased production of neutrophils. This may be due to:  Certain medicines such as chemotherapy.  Genetic problems.  Cancer.  Radiation treatments.  Vitamin deficiency.  Some pesticides.  Increased destruction of neutrophils. This may be due to:  Overwhelming infections.  Hemolytic anemia. This is when the body destroys its own blood cells.  Chemotherapy.  Neutrophils moving to areas of the body where they cannot fight infections. This may be due to:  Dialysis procedures.  Conditions where the spleen becomes enlarged. Neutrophils are held in the spleen and are not available to the rest of the body.  Overwhelming infections. The neutrophils are held in the area of the infection and are not available to the rest of the body. SYMPTOMS  There are no specific symptoms of neutropenia. The lack of neutrophils can result in an infection, and an infection can cause various problems. DIAGNOSIS  Diagnosis is made by a blood test. A complete blood count is performed. The normal level of neutrophils in human blood differs with age and race. Infants have lower counts than older children and adults. African Americans have lower counts than Caucasians or Asians. The average adult level is 1500 cells/mm3 of blood. Neutrophil counts are interpreted as follows:  Greater than 1000 cells/mm3 gives normal protection against infection.  500 to 1000 cells/mm3 gives an increased risk for  infection.  200 to 500 cells/mm3 is a greater risk for severe infection.  Lower than 200 cells/mm3 is a marked risk of infection. This may require hospitalization and treatment with antibiotic medicines. TREATMENT  Treatment depends on the underlying cause, severity, and presence of infections or symptoms. It also depends on your health. Your caregiver will discuss the treatment plan with you. Mild cases are often easily treated and have a good outcome. Preventative measures may also be started to limit your risk of infections. Treatment can include:  Taking antibiotics.  Stopping medicines that are known to cause neutropenia.  Correcting nutritional deficiencies by eating green vegetables to supply folic acid and taking vitamin B supplements.  Stopping exposure to pesticides if your neutropenia is related to pesticide exposure.  Taking a blood growth factor called sargramostim, pegfilgrastim, or filgrastim if you are undergoing chemotherapy for cancer. This stimulates white blood cell production.  Removal of the spleen if you have Felty's syndrome and have repeated infections. HOME CARE INSTRUCTIONS   Follow your caregiver's instructions about when you need to have blood work done.  Wash your hands often. Make sure others who come in contact with you also wash their hands.  Wash raw fruits and vegetables before eating them. They can carry bacteria and fungi.  Avoid people with colds or spreadable (contagious) diseases (chickenpox, herpes zoster, influenza).  Avoid large crowds.  Avoid construction areas. The dust can release fungus into the air.  Be cautious around children in daycare or school environments.  Take care of your respiratory system by coughing and deep breathing.  Bathe daily.  Protect your skin from cuts and   burns.  Do not work in the garden or with flowers and plants.  Care for the mouth before and after meals by brushing with a soft toothbrush. If you have  mucositis, do not use mouthwash. Mouthwash contains alcohol and can dry out the mouth even more.  Clean the area between the genitals and the anus (perineal area) after urination and bowel movements. Women need to wipe from front to back.  Use a water soluble lubricant during sexual intercourse and practice good hygiene after. Do not have intercourse if you are severely neutropenic. Check with your caregiver for guidelines.  Exercise daily as tolerated.  Avoid people who were vaccinated with a live vaccine in the past 30 days. You should not receive live vaccines (polio, typhoid).  Do not provide direct care for pets. Avoid animal droppings. Do not clean litter boxes and bird cages.  Do not share food utensils.  Do not use tampons, enemas, or rectal suppositories unless directed by your caregiver.  Use an electric razor to remove hair.  Wash your hands after handling magazines, letters, and newspapers. SEEK IMMEDIATE MEDICAL CARE IF:   You have a fever.  You have chills or start to shake.  You feel nauseous or vomit.  You develop mouth sores.  You develop aches and pains.  You have redness and swelling around open wounds.  Your skin is warm to the touch.  You have pus coming from your wounds.  You develop swollen lymph nodes.  You feel weak or fatigued.  You develop red streaks on the skin. MAKE SURE YOU:  Understand these instructions.  Will watch your condition.  Will get help right away if you are not doing well or get worse. Document Released: 05/05/2002 Document Revised: 02/05/2012 Document Reviewed: 06/02/2011 [employer] Patient Information 2014 [employer].  

## 2013-12-19 NOTE — Progress Notes (Signed)
[place] OFFICE PROGRESS NOTE  Lilian Coma, MD [address] [place] [address]  DIAGNOSIS: Smothering Multiple myeloma - Plan: CBC with Differential, IgG, IgA, IgM, Kappa/lambda light chains  Leukocytopenia, unspecified  Anemia, unspecified - Plan: CBC with Differential, Comprehensive metabolic panel (Cmet) - [facility], IgG, IgA, IgM, Kappa/lambda light chains, Ferritin, Iron and TIBC  Chief Complaint  Patient presents with  . Smothering Multiple myeloma    CURRENT THERAPY: Observation.   INTERVAL HISTORY: [name] 53 y.o. female with a history of smothering Multiple myeloma is here for follow-up.  She was last seen by me on 09/17/2013 and by Dr.  Santiago Bur nearly two years ago on 10/05/2011.  She reports that she had lost her insurance coverage and was unable to follow-up as recommended. Today, she denies any fevers, chills, night sweats or weight lost.  She also denies any bone aches or pains.  She continues to exercise at least three days a week with squats causing some discomfort to her knees.  Otherwise, she denies any recent hospitalizations. Her last visit with her PCP Dr. Stephanie Acre was in September.   MEDICAL HISTORY: Past Medical History  Diagnosis Date  . Anemia   . Leukopenia 2001  . Monoclonal gammopathy 06/2011    INTERIM HISTORY: has Multiple myeloma; Leukocytopenia, unspecified; and Anemia, unspecified on her problem list.    ALLERGIES:  has No Known Allergies.  MEDICATIONS: has a current medication list which includes the following prescription(s): ferrous sulfate.  SURGICAL HISTORY:  Past Surgical History  Procedure Laterality Date  . History c-section    . Abdominal hysterectomy    . Bi-lateral salpingo oophorec  2008    REVIEW OF SYSTEMS:   Constitutional: Denies fevers, chills or abnormal weight loss Eyes: Denies blurriness of vision Ears, nose, mouth, throat, and face: Denies mucositis or sore  throat Respiratory: Denies cough, dyspnea or wheezes Cardiovascular: Denies palpitation, chest discomfort or lower extremity swelling Gastrointestinal:  Denies nausea, heartburn or change in bowel habits Skin: Denies abnormal skin rashes Lymphatics: Denies new lymphadenopathy or easy bruising Neurological:Denies numbness, tingling or new weaknesses Behavioral/Psych: Mood is stable, no new changes  All other systems were reviewed with the patient and are negative.  PHYSICAL EXAMINATION: ECOG PERFORMANCE STATUS: 0 - Asymptomatic  Blood pressure 135/84, pulse 99, temperature 97.9 F (36.6 C), temperature source Oral, resp. rate 20, height $RemoveBe'5\' 8"'XoqocLXoi$  (1.727 Daugherty), weight 186 lb (84.369 kg), SpO2 100.00%.  GENERAL:alert, no distress and comfortable; well developed and well-nourished.  Anxious. SKIN: skin color, texture, turgor are normal, no rashes or significant lesions EYES: normal, Conjunctiva are pink and non-injected, sclera clear OROPHARYNX:no exudate, no erythema and lips, buccal mucosa, and tongue normal  NECK: supple, thyroid normal size, non-tender, without nodularity LYMPH:  no palpable lymphadenopathy in the cervical, axillary or supraclavicular LUNGS: clear to auscultation and percussion with normal breathing effort HEART: regular rate & rhythm and no murmurs and no lower extremity edema ABDOMEN:abdomen soft, non-tender and normal bowel sounds Musculoskeletal:no cyanosis of digits and no clubbing  NEURO: alert & oriented x 3 with fluent speech, no focal motor/sensory deficits  Labs:  Lab Results  Component Value Date   WBC 2.6* 12/16/2013   HGB 11.2* 12/16/2013   HCT 34.2* 12/16/2013   MCV 88.8 12/16/2013   PLT 224 12/16/2013   NEUTROABS 1.1* 12/16/2013      Chemistry      Component Value Date/Time   NA 140 12/16/2013 1511   NA  140 10/05/2011 1549   K 4.1 12/16/2013 1511   K 4.3 10/05/2011 1549   CL 105 10/05/2011 1549   CO2 27 12/16/2013 1511   CO2 27 10/05/2011 1549   BUN 13.2  12/16/2013 1511   BUN 14 10/05/2011 1549   CREATININE 0.7 12/16/2013 1511   CREATININE 0.63 10/05/2011 1549      Component Value Date/Time   CALCIUM 9.4 12/16/2013 1511   CALCIUM 9.5 10/05/2011 1549   ALKPHOS 50 12/16/2013 1511   ALKPHOS 61 10/05/2011 1549   AST 17 12/16/2013 1511   AST 14 10/05/2011 1549   ALT 12 12/16/2013 1511   ALT 12 10/05/2011 1549   BILITOT 0.34 12/16/2013 1511   BILITOT 0.1* 10/05/2011 1549      Basic Metabolic Panel:  Recent Labs Lab 12/16/13 1511  NA 140  K 4.1  CO2 27  GLUCOSE 94  BUN 13.2  CREATININE 0.7  CALCIUM 9.4   GFR Estimated Creatinine Clearance: 92.6 ml/min (by C-G formula based on Cr of 0.7). Liver Function Tests:  Recent Labs Lab 12/16/13 1511  AST 17  ALT 12  ALKPHOS 50  BILITOT 0.34  PROT 8.2  ALBUMIN 4.1   CBC:  Recent Labs Lab 12/16/13 1510  WBC 2.6*  NEUTROABS 1.1*  HGB 11.2*  HCT 34.2*  MCV 88.8  PLT 224    Studies:  No results found. RADIOGRAPHIC STUDIES: MR Pelvis W Wo Contrast (09/01/2011)  IMPRESSION:  1. The abnormality in the left ischium on prior osseous survey is artifactual. There is no osseous lesion or marrow replacement in this region on MRI.  2. Mild edema and enhancement in the medial left iliac bone,  compatible with recent bone marrow biopsy.  US Breast Bilateral 09/10/2013   CLINICAL DATA:  Followup right breast mass. Left axillary lump, felt to be a lipoma by her physician.  EXAM: DIGITAL DIAGNOSTIC UNILATERAL RIGHT MAMMOGRAM; BILATERAL BREAST ULTRASOUND  COMPARISON:  Multiple priors  ACR Breast Density Category b: There are scattered areas of fibroglandular density.  FINDINGS: The previously seen mass in the upper central right breast is less apparent mammographically than on the prior examination.  Mammographic images were processed with CAD.  Targeted physical examination of the upper central right breast is negative. Just lateral to the left axilla, there is a soft mass.  Targeted ultrasound of  the right breast demonstrates a 4 x 3 x 5 mm predominately anechoic mass at 12 o'clock, 2 cm from the nipple. This has decreased in size from the previous examination in which it measured 6 x 9 x 9 mm, indicating benignity. Just lateral to the left axilla, in the area of palpable concern, fatty tissue is identified sonographically.  IMPRESSION: 1. The previously seen right breast mass has decreased in size since the prior examination, confirming the suspicion that this represents a cluster of microcysts. 2. Palpable abnormality lateral to the left axilla, with ultrasound findings consistent with a lipoma.  RECOMMENDATION: Bilateral screening mammogram in March 2015. Further management of the patient's palpable abnormality should be on a clinical basis.  I have discussed the findings and recommendations with the patient. Results were also provided in writing at the conclusion of the visit. If applicable, a reminder letter will be sent to the patient regarding the next appointment.  BI-RADS CATEGORY  2: Benign Finding(s)   Electronically Signed   By: Jerene Dilling Daugherty.D.   On: 09/10/2013 11:40   Dg Bone Survey Met  09/17/2013   CLINICAL DATA:  Multiple myeloma.  EXAM: METASTATIC BONE SURVEY  COMPARISON:  08/16/2011  FINDINGS: Degenerative changes in the cervical, thoracic and upper lumbar spine. Again noted is the solitary lytic lesion within the left inferior pubic ramus, unchanged. No additional suspicious focal lytic lesions. Heart is upper limits normal in size. Lungs are clear. No effusions.  IMPRESSION: Solitary focal lytic lesion within the left inferior pubic ramus, unchanged.   Electronically Signed   By: Rolm Baptise Daugherty.D.   On: 09/17/2013 18:31   Mm Digital Diagnostic Unilat R  09/10/2013   CLINICAL DATA:  Followup right breast mass. Left axillary lump, felt to be a lipoma by her physician.  EXAM: DIGITAL DIAGNOSTIC UNILATERAL RIGHT MAMMOGRAM; BILATERAL BREAST ULTRASOUND  COMPARISON:  Multiple  priors  ACR Breast Density Category b: There are scattered areas of fibroglandular density.  FINDINGS: The previously seen mass in the upper central right breast is less apparent mammographically than on the prior examination.  Mammographic images were processed with CAD.  Targeted physical examination of the upper central right breast is negative. Just lateral to the left axilla, there is a soft mass.  Targeted ultrasound of the right breast demonstrates a 4 x 3 x 5 mm predominately anechoic mass at 12 o'clock, 2 cm from the nipple. This has decreased in size from the previous examination in which it measured 6 x 9 x 9 mm, indicating benignity. Just lateral to the left axilla, in the area of palpable concern, fatty tissue is identified sonographically.  IMPRESSION: 1. The previously seen right breast mass has decreased in size since the prior examination, confirming the suspicion that this represents a cluster of microcysts. 2. Palpable abnormality lateral to the left axilla, with ultrasound findings consistent with a lipoma.  RECOMMENDATION: Bilateral screening mammogram in March 2015. Further management of the patient's palpable abnormality should be on a clinical basis.  I have discussed the findings and recommendations with the patient. Results were also provided in writing at the conclusion of the visit. If applicable, a reminder letter will be sent to the patient regarding the next appointment.  BI-RADS CATEGORY  2: Benign Finding(s)   Electronically Signed   By: Donavan Burnet Daugherty.D.   On: 09/10/2013 11:40   BONE MARROW BIOPSY (08/11/2011) Bone Marrow, Aspirate,Biopsy, and Clot - PLASMA CELL DYSCRASIA, PLEASE SEE COMMENT. - VARIABLY CELLULAR MARROW WITH TRILINEAGE HEMATOPOIESIS. PLEASE SEE COMMENT. PERIPHERAL BLOOD: - MICROCYTIC ANEMIA Bone Marrow count performed on 500 cells shows: Blasts: 0% Myeloid 49% Promyelocyts: 1% Myelocytes: 5% Erythroid 27% Metamyelocyts: 8% Bands: 12% Lymphocytes:  11% Neutrophils: 16% Eosinophils: 7% Plasma Cells: 12% Basophils: 0% Monocytes: 1% Daugherty:E ratio: 1.8:1   ASSESSMENT: [name] [age] y.o. female with a history of Smothering Multiple myeloma - Plan: CBC with Differential, IgG, IgA, IgM, Kappa/lambda light chains  Leukocytopenia, unspecified  Anemia, unspecified - Plan: CBC with Differential, Comprehensive metabolic panel (Cmet) - [facility], IgG, IgA, IgM, Kappa/lambda light chains, Ferritin, Iron and TIBC  ASSESSMENT & PLAN: Patient is an 53 yo female with  diagnosed smothering IgG kappa Multiple myeloma complicated by mild  normocytic anemia. Based on NCCN guidelines defining end organ damage with creatinine greater than 2 or a hemoglobin less than 10, she would be considered as smothering Multiple myeloma. She would be  a candidate for transplant based on his young age and the absence of  co-morbidities.   1. Smothering IgG kappa multiple myeloma, with bone marrow aspirate and biopsy carried out on 08/12/2013 showing 12% plasma cells. IgG  level on 09/17/2013 is 2060 down from 2680 on 11/02/2011.  Kappa Lambda ratio of 1.12 down from 2.83 in 2012.  Total protein 8.3. Calcium of 9.2.  Metastatic bone survey followed by MRI of carried out on 09/01/2011 was negative for evidence of multiple myeloma. A 24-hour urine protein collected on 01/29/2013 was 41.5 mg. Urine immunofixation on 08/01/2011 showed a monoclonal IgG heavy chain with associated kappa light chain electrophoresis an excess monoclonal free lambda light chains.  --We ordered her annual skeletal survey which is as noted above.  Stable, without new lesions.   The prior lesion was investigated with an MRI and it was not a lytic lesion but an artifact.   --She denies any constitutional symptoms including fevers, chills or bone pain.  2. Normomyctic normochromic mildly iron-defiency anemia likely secondary to #1 versus iron-defiency. Her bone marrow biopsy 2 years ago demonstrated low iron  stores.  Anemia with hemoglobin 11.0 up from 10.2 and 9.7 in 10/05/2011. Overall trend is increasing.  She was on iron in the past and continues on this prn.   Her prior CBC did demonstrate an increased RDW. We will check her ferritin and iron levels next visit.    3. Leukopenia.   Her WBC is 3.7 on 09/17/2013 stable from her WBC of 3.8 on 11/02/2011.   It has decreased today.  She denies any recurrent infections.  Her ANC is 1,100.  We provided her a handout on recurrent infections and neutropenia.  She was instructed to report any infections and/or persistent fevers.    4. Benign right breast mass --Bilateral screening mammogram in March 2015 based on her recent mammogram. Further management of the patient's palpable abnormality should be on a clinical basis.  5. Follow-up. -- For now, we will continue surveillance every 3 months with labs including CBC, CMP, SPEP and kappa/lamba light chains and urine immunofixation studies.   All questions were answered. The patient knows to call the clinic with any problems, questions or concerns. We can certainly see the patient much sooner if necessary.  I spent 15 minutes counseling the patient face to face. The total time spent in the appointment was 25 minutes.    Truda Staub, MD 12/19/2013 9:35 PM

## 2013-12-22 ENCOUNTER — Telehealth: Payer: Self-pay | Admitting: Internal Medicine

## 2013-12-22 NOTE — Telephone Encounter (Signed)
no vm set up...mailed pt appt sched, avs and letter

## 2013-12-23 LAB — UIFE/LIGHT CHAINS/TP QN, 24-HR UR
ALPHA 2 UR: DETECTED — AB
Albumin, U: DETECTED
Alpha 1, Urine: DETECTED — AB
BETA UR: DETECTED — AB
FREE KAPPA/LAMBDA RATIO: 28.33 ratio — AB (ref 2.04–10.37)
FREE LT CHN EXCR RATE: 8.5 mg/d
Free Kappa Lt Chains,Ur: 0.85 mg/dL (ref 0.14–2.42)
Free Lambda Excretion/Day: 0.3 mg/d
Free Lambda Lt Chains,Ur: <0.03 mg/dL (ref 0.02–0.67)
Gamma Globulin, Urine: DETECTED — AB
TOTAL PROTEIN, URINE-UPE24: 1.4 mg/dL
Time: 24 hours
Total Protein, Urine-Ur/day: 14 mg/d (ref 10–140)
Volume, Urine: 1000 mL

## 2013-12-23 LAB — IMMUNOFIXATION ELECTROPHORESIS
IGA: 112 mg/dL (ref 69–380)
IGM, SERUM: 5 mg/dL — AB (ref 52–322)
IgG (Immunoglobin G), Serum: 2140 mg/dL — ABNORMAL HIGH (ref 690–1700)
Total Protein, Serum Electrophoresis: 7.8 g/dL (ref 6.0–8.3)

## 2014-02-13 ENCOUNTER — Other Ambulatory Visit: Payer: Self-pay

## 2014-02-13 DIAGNOSIS — Z1231 Encounter for screening mammogram for malignant neoplasm of breast: Secondary | ICD-10-CM

## 2014-02-24 ENCOUNTER — Ambulatory Visit
Admission: RE | Admit: 2014-02-24 | Discharge: 2014-02-24 | Disposition: A | Discharge status: Home / Self Care | Payer: 59 | Source: Ambulatory Visit

## 2014-02-24 DIAGNOSIS — Z1231 Encounter for screening mammogram for malignant neoplasm of breast: Secondary | ICD-10-CM

## 2014-03-19 ENCOUNTER — Telehealth: Payer: Self-pay | Admitting: Internal Medicine

## 2014-03-19 ENCOUNTER — Other Ambulatory Visit (HOSPITAL_BASED_OUTPATIENT_CLINIC_OR_DEPARTMENT_OTHER): Payer: 59

## 2014-03-19 ENCOUNTER — Ambulatory Visit (HOSPITAL_BASED_OUTPATIENT_CLINIC_OR_DEPARTMENT_OTHER): Payer: 59 | Admitting: Internal Medicine

## 2014-03-19 VITALS — BP 157/87 | HR 76 | Temp 97.9°F | Resp 18 | Ht 68.0 in | Wt 187.5 lb

## 2014-03-19 DIAGNOSIS — C9000 Multiple myeloma not having achieved remission: Secondary | ICD-10-CM

## 2014-03-19 DIAGNOSIS — D72819 Decreased white blood cell count, unspecified: Secondary | ICD-10-CM

## 2014-03-19 DIAGNOSIS — D649 Anemia, unspecified: Secondary | ICD-10-CM

## 2014-03-19 LAB — COMPREHENSIVE METABOLIC PANEL (CC13)
ALT: 16 U/L (ref 0–55)
ANION GAP: 11 meq/L (ref 3–11)
AST: 17 U/L (ref 5–34)
Albumin: 4.2 g/dL (ref 3.5–5.0)
Alkaline Phosphatase: 56 U/L (ref 40–150)
BUN: 16.1 mg/dL (ref 7.0–26.0)
CO2: 24 meq/L (ref 22–29)
Calcium: 9.6 mg/dL (ref 8.4–10.4)
Chloride: 108 mEq/L (ref 98–109)
Creatinine: 0.7 mg/dL (ref 0.6–1.1)
GLUCOSE: 73 mg/dL (ref 70–140)
POTASSIUM: 3.8 meq/L (ref 3.5–5.1)
SODIUM: 142 meq/L (ref 136–145)
TOTAL PROTEIN: 8.5 g/dL — AB (ref 6.4–8.3)
Total Bilirubin: 0.31 mg/dL (ref 0.20–1.20)

## 2014-03-19 LAB — CBC WITH DIFFERENTIAL/PLATELET
BASO%: 0.7 % (ref 0.0–2.0)
Basophils Absolute: 0 10*3/uL (ref 0.0–0.1)
EOS ABS: 0 10*3/uL (ref 0.0–0.5)
EOS%: 0.4 % (ref 0.0–7.0)
HCT: 35 % (ref 34.8–46.6)
HGB: 11.4 g/dL — ABNORMAL LOW (ref 11.6–15.9)
LYMPH%: 43.9 % (ref 14.0–49.7)
MCH: 29 pg (ref 25.1–34.0)
MCHC: 32.6 g/dL (ref 31.5–36.0)
MCV: 89.1 fL (ref 79.5–101.0)
MONO#: 0.3 10*3/uL (ref 0.1–0.9)
MONO%: 10.7 % (ref 0.0–14.0)
NEUT%: 44.3 % (ref 38.4–76.8)
NEUTROS ABS: 1.2 10*3/uL — AB (ref 1.5–6.5)
Platelets: 275 10*3/uL (ref 145–400)
RBC: 3.93 10*6/uL (ref 3.70–5.45)
RDW: 14 % (ref 11.2–14.5)
WBC: 2.8 10*3/uL — ABNORMAL LOW (ref 3.9–10.3)
lymph#: 1.2 10*3/uL (ref 0.9–3.3)

## 2014-03-19 LAB — IRON AND TIBC CHCC
%SAT: 22 % (ref 21–57)
Iron: 58 ug/dL (ref 41–142)
TIBC: 264 ug/dL (ref 236–444)
UIBC: 206 ug/dL (ref 120–384)

## 2014-03-19 LAB — FERRITIN CHCC: FERRITIN: 131 ng/mL (ref 9–269)

## 2014-03-19 NOTE — Telephone Encounter (Signed)
Gave pt appt for lab and MD for July 2015 °

## 2014-03-19 NOTE — Progress Notes (Signed)
[facility] OFFICE PROGRESS NOTE  Danielle Reeve, MD [address] [place] [address]  DIAGNOSIS: Smothering Multiple myeloma - Plan: CBC with Differential, Comprehensive metabolic panel (Cmet) - [facility], IgG, IgA, IgM, Kappa/lambda light chains  Anemia, unspecified - Plan: CBC with Differential  Chief Complaint  Patient presents with  . Multiple Myeloma    CURRENT THERAPY: Observation.   INTERVAL HISTORY: [name] with a history of smothering Multiple myeloma is here for follow-up.  She was last seen by me on 12/19/2013. Today, she denies any fevers, chills, night sweats or weight lost.  She also denies any bone aches or pains.  She continues to exercise at least three days a week but stopped squats.  She is doing brisk walking.   Otherwise, she denies any recent hospitalizations. Her last visit with her PCP Dr. Paulino Daugherty was in September. She had mammogram which was negative. She has yet to have a screening colonoscopy. She denies any infections.   MEDICAL HISTORY: Past Medical History  Diagnosis Date  . Anemia   . Leukopenia 2001  . Monoclonal gammopathy 06/2011    INTERIM HISTORY: has Multiple myeloma; Leukocytopenia, unspecified; and Anemia, unspecified on her problem list.    ALLERGIES:  has No Known Allergies.  MEDICATIONS: has a current medication list which includes the following prescription(s): ferrous sulfate.  SURGICAL HISTORY:  Past Surgical History  Procedure Laterality Date  . History c-section    . Abdominal hysterectomy    . Bi-lateral salpingo oophorec  2008    REVIEW OF SYSTEMS:   Constitutional: Denies fevers, chills or abnormal weight loss Eyes: Denies blurriness of vision Ears, nose, mouth, throat, and face: Denies mucositis or sore throat Respiratory: Denies cough, dyspnea or wheezes Cardiovascular: Denies palpitation, chest discomfort or lower extremity swelling Gastrointestinal:  Denies  nausea, heartburn or change in bowel habits Skin: Denies abnormal skin rashes Lymphatics: Denies new lymphadenopathy or easy bruising Neurological:Denies numbness, tingling or new weaknesses Behavioral/Psych: Mood is stable, no new changes  All other systems were reviewed with the patient and are negative.  PHYSICAL EXAMINATION: ECOG PERFORMANCE STATUS: 0 - Asymptomatic  Blood pressure 157/87, pulse 76, temperature 97.9 F (36.6 C), temperature source Oral, resp. rate 18, height 5\' 8"  (1.727 m), weight 187 lb 8 oz (85.049 kg), SpO2 100.00%.  GENERAL:alert, no distress and comfortable; well developed and well-nourished.   SKIN: skin color, texture, turgor are normal, no rashes or significant lesions EYES: normal, Conjunctiva are pink and non-injected, sclera clear OROPHARYNX:no exudate, no erythema and lips, buccal mucosa, and tongue normal  NECK: supple, thyroid normal size, non-tender, without nodularity LYMPH:  no palpable lymphadenopathy in the cervical, axillary or supraclavicular LUNGS: clear to auscultation and percussion with normal breathing effort HEART: regular rate & rhythm and no murmurs and no lower extremity edema ABDOMEN:abdomen soft, non-tender and normal bowel sounds Musculoskeletal:no cyanosis of digits and no clubbing  NEURO: alert & oriented x 3 with fluent speech, no focal motor/sensory deficits  Labs:  Lab Results  Component Value Date   WBC 2.8* 03/19/2014   HGB 11.4* 03/19/2014   HCT 35.0 03/19/2014   MCV 89.1 03/19/2014   PLT 275 03/19/2014   NEUTROABS 1.2* 03/19/2014      Chemistry      Component Value Date/Time   NA 142 03/19/2014 1240   NA 140 10/05/2011 1549   K 3.8 03/19/2014 1240   K 4.3 10/05/2011 1549   CL 105 10/05/2011 1549  CO2 24 03/19/2014 1240   CO2 27 10/05/2011 1549   BUN 16.1 03/19/2014 1240   BUN 14 10/05/2011 1549   CREATININE 0.7 03/19/2014 1240   CREATININE 0.63 10/05/2011 1549      Component Value Date/Time   CALCIUM 9.6 03/19/2014  1240   CALCIUM 9.5 10/05/2011 1549   ALKPHOS 56 03/19/2014 1240   ALKPHOS 61 10/05/2011 1549   AST 17 03/19/2014 1240   AST 14 10/05/2011 1549   ALT 16 03/19/2014 1240   ALT 12 10/05/2011 1549   BILITOT 0.31 03/19/2014 1240   BILITOT 0.1* 10/05/2011 1549     CBC:  Recent Labs Lab 03/19/14 1240  WBC 2.8*  NEUTROABS 1.2*  HGB 11.4*  HCT 35.0  MCV 89.1  PLT 275    Studies:  No results found. RADIOGRAPHIC STUDIES: MR Pelvis W Wo Contrast (09/01/2011)  IMPRESSION:  1. The abnormality in the left ischium on prior osseous survey is artifactual. There is no osseous lesion or marrow replacement in this region on MRI.  2. Mild edema and enhancement in the medial left iliac bone,  compatible with recent bone marrow biopsy.  US Breast Bilateral 09/10/2013   CLINICAL DATA:  Followup right breast mass. Left axillary lump, felt to be a lipoma by her physician.  EXAM: DIGITAL DIAGNOSTIC UNILATERAL RIGHT MAMMOGRAM; BILATERAL BREAST ULTRASOUND  COMPARISON:  Multiple priors  ACR Breast Density Category b: There are scattered areas of fibroglandular density.  FINDINGS: The previously seen mass in the upper central right breast is less apparent mammographically than on the prior examination.  Mammographic images were processed with CAD.  Targeted physical examination of the upper central right breast is negative. Just lateral to the left axilla, there is a soft mass.  Targeted ultrasound of the right breast demonstrates a 4 x 3 x 5 mm predominately anechoic mass at 12 o'clock, 2 cm from the nipple. This has decreased in size from the previous examination in which it measured 6 x 9 x 9 mm, indicating benignity. Just lateral to the left axilla, in the area of palpable concern, fatty tissue is identified sonographically.  IMPRESSION: 1. The previously seen right breast mass has decreased in size since the prior examination, confirming the suspicion that this represents a cluster of microcysts. 2. Palpable  abnormality lateral to the left axilla, with ultrasound findings consistent with a lipoma.  RECOMMENDATION: Bilateral screening mammogram in March 2015. Further management of the patient's palpable abnormality should be on a clinical basis.  I have discussed the findings and recommendations with the patient. Results were also provided in writing at the conclusion of the visit. If applicable, a reminder letter will be sent to the patient regarding the next appointment.  BI-RADS CATEGORY  2: Benign Finding(s)   Electronically Signed   By: Donavan Burnet M.D.   On: 09/10/2013 11:40   Dg Bone Survey Met  09/17/2013   CLINICAL DATA:  Multiple myeloma.  EXAM: METASTATIC BONE SURVEY  COMPARISON:  08/16/2011  FINDINGS: Degenerative changes in the cervical, thoracic and upper lumbar spine. Again noted is the solitary lytic lesion within the left inferior pubic ramus, unchanged. No additional suspicious focal lytic lesions. Heart is upper limits normal in size. Lungs are clear. No effusions.  IMPRESSION: Solitary focal lytic lesion within the left inferior pubic ramus, unchanged.   Electronically Signed   By: Rolm Baptise M.D.   On: 09/17/2013 18:31   Mm Digital Diagnostic Unilat R  09/10/2013   CLINICAL DATA:  Followup right breast mass. Left axillary lump, felt to be a lipoma by her physician.  EXAM: DIGITAL DIAGNOSTIC UNILATERAL RIGHT MAMMOGRAM; BILATERAL BREAST ULTRASOUND  COMPARISON:  Multiple priors  ACR Breast Density Category b: There are scattered areas of fibroglandular density.  FINDINGS: The previously seen mass in the upper central right breast is less apparent mammographically than on the prior examination.  Mammographic images were processed with CAD.  Targeted physical examination of the upper central right breast is negative. Just lateral to the left axilla, there is a soft mass.  Targeted ultrasound of the right breast demonstrates a 4 x 3 x 5 mm predominately anechoic mass at 12 o'clock, 2 cm  from the nipple. This has decreased in size from the previous examination in which it measured 6 x 9 x 9 mm, indicating benignity. Just lateral to the left axilla, in the area of palpable concern, fatty tissue is identified sonographically.  IMPRESSION: 1. The previously seen right breast mass has decreased in size since the prior examination, confirming the suspicion that this represents a cluster of microcysts. 2. Palpable abnormality lateral to the left axilla, with ultrasound findings consistent with a lipoma.  RECOMMENDATION: Bilateral screening mammogram in March 2015. Further management of the patient's palpable abnormality should be on a clinical basis.  I have discussed the findings and recommendations with the patient. Results were also provided in writing at the conclusion of the visit. If applicable, a reminder letter will be sent to the patient regarding the next appointment.  BI-RADS CATEGORY  2: Benign Finding(s)   Electronically Signed   By: Jerene Dilling M.D.   On: 09/10/2013 11:40   Mm Digital Bilateral  EXAM:  DIGITAL SCREENING BILATERAL MAMMOGRAM WITH CAD  COMPARISON: Previous exam(s).  ACR Breast Density Category c: The breast tissue is heterogeneously  dense, which may obscure small masses.  FINDINGS:  There are no findings suspicious for malignancy. Images were  processed with CAD.  IMPRESSION:  No mammographic evidence of malignancy. A result letter of this  screening mammogram will be mailed directly to the patient.  RECOMMENDATION:  Screening mammogram in one year. (Code:SM-B-01Y)  BI-RADS CATEGORY 1: Negative.   BONE MARROW BIOPSY (08/11/2011) Bone Marrow, Aspirate,Biopsy, and Clot - PLASMA CELL DYSCRASIA, PLEASE SEE COMMENT. - VARIABLY CELLULAR MARROW WITH TRILINEAGE HEMATOPOIESIS. PLEASE SEE COMMENT. PERIPHERAL BLOOD: - MICROCYTIC ANEMIA Bone Marrow count performed on 500 cells shows: Blasts: 0% Myeloid 49% Promyelocyts: 1% Myelocytes: 5% Erythroid  27% Metamyelocyts: 8% Bands: 12% Lymphocytes: 11% Neutrophils: 16% Eosinophils: 7% Plasma Cells: 12% Basophils: 0% Monocytes: 1% M:E ratio: 1.8:1   ASSESSMENT: Jakyia M [age] y.o. Daugherty with a history of Smothering Multiple myeloma - Plan: CBC with Differential, Comprehensive metabolic panel (Cmet) - [facility], IgG, IgA, IgM, Kappa/lambda light chains  Anemia, unspecified - Plan: CBC with Differential  ASSESSMENT & PLAN: Patient is an 53 yo Daugherty with  diagnosed smothering IgG kappa Multiple myeloma complicated by mild  normocytic anemia. Based on NCCN guidelines defining end organ damage with creatinine greater than 2 or a hemoglobin less than 10, she would be considered as smothering Multiple myeloma. She would be a candidate for transplant based on his young age and the absence of  co-morbidities.   1. Smothering IgG kappa multiple myeloma, with bone marrow aspirate and biopsy carried out on 08/12/2013 showing 12% plasma cells. IgG level on 09/17/2013 is 2060 down from 2680 on 11/02/2011.  Kappa Lambda ratio of 1.12 down from 2.83 in 2012.  Total  protein 8.3. Calcium of 9.6.   --Metastatic bone survey followed by MRI of carried out on 09/01/2011 was negative for evidence of multiple myeloma. A 24-hour urine protein collected on 1/23/2015was 14 mg. Urine immunofixation on 08/01/2011 showed a monoclonal IgG heavy chain with associated kappa light chain electrophoresis an excess monoclonal free lambda light chains.  --We ordered her annual skeletal survey which is as noted above.  Stable, without new lesions.   The prior lesion was investigated with an MRI and it was not a lytic lesion but an artifact.   --She denies any constitutional symptoms including fevers, chills or bone pain.  2. Normomyctic normochromic mildly iron-defiency anemia likely secondary to #1 versus iron-defiency. Her bone marrow biopsy 2 years ago demonstrated low iron stores.  Anemia with hemoglobin 11.4 up from 11.0 up from  10.2 and 9.7 in 10/05/2011. Overall trend is increasing.  She was on iron in the past and continues on this prn.   Her prior CBC did demonstrate an increased RDW. We are awaiting her ferritin and iron levels this visit..    3. Leukopenia.   Her WBC is 2.8 today which is stable.  She denies any recurrent infections.  Her ANC is 1,100.  We provided her a handout on recurrent infections and neutropenia.  She was instructed to report any infections and/or persistent fevers.    4. Benign right breast mass --Bilateral screening mammogram in March 2015 based on her recent mammogram as noted above. . Further management of the patient's palpable abnormality should be on a clinical basis.  5. Follow-up. -- For now, we will continue surveillance every 3 months with labs including CBC, CMP, SPEP and kappa/lamba light chains and urine immunofixation studies. She has been instructed to have a screening colonoscopy.   All questions were answered. The patient knows to call the clinic with any problems, questions or concerns. We can certainly see the patient much sooner if necessary.  I spent 15 minutes counseling the patient face to face. The total time spent in the appointment was 25 minutes.    Concha Norway, MD 03/19/2014 1:30 PM

## 2014-03-24 LAB — IGG, IGA, IGM
IGG (IMMUNOGLOBIN G), SERUM: 2060 mg/dL — AB (ref 690–1700)
IgA: 108 mg/dL (ref 69–380)
IgM, Serum: 6 mg/dL — ABNORMAL LOW (ref 52–322)

## 2014-03-24 LAB — KAPPA/LAMBDA LIGHT CHAINS
Kappa free light chain: 1.42 mg/dL (ref 0.33–1.94)
Kappa:Lambda Ratio: 1.2 (ref 0.26–1.65)
Lambda Free Lght Chn: 1.18 mg/dL (ref 0.57–2.63)

## 2014-04-27 ENCOUNTER — Encounter: Payer: Self-pay | Admitting: Gynecology

## 2014-04-27 ENCOUNTER — Ambulatory Visit (INDEPENDENT_AMBULATORY_CARE_PROVIDER_SITE_OTHER): Payer: 59 | Admitting: Gynecology

## 2014-04-27 VITALS — BP 110/78 | HR 80 | Resp 16 | Ht 68.0 in | Wt 188.0 lb

## 2014-04-27 DIAGNOSIS — Z01419 Encounter for gynecological examination (general) (routine) without abnormal findings: Secondary | ICD-10-CM

## 2014-04-27 DIAGNOSIS — Z0000 Encounter for general adult medical examination without abnormal findings: Secondary | ICD-10-CM

## 2014-04-27 LAB — POCT URINALYSIS DIPSTICK
Urobilinogen, UA: NEGATIVE
pH, UA: 5

## 2014-04-27 NOTE — Progress Notes (Signed)
53 y.o. Divorced  [place] American female   G1P0101 here for annual exam. Pt reports menses are absent due to Hysterectomy.  She does not report post-menopasual bleeding.  Pt sees Dr Juliann Mule for her MM- no treatment to date.  No dyspareunia, condoms.    No LMP recorded. Patient has had a hysterectomy.          Sexually active: yes  The current method of family planning is status post hysterectomy.    Exercising: yes  walking 3x/wk Last pap: 2012  Abnormal PAP: no Mammogram: 02/24/14 Bi-Rads 1  BSE: yes  Colonoscopy: 20+ years ago normal DEXA: 10 years ago - normal Alcohol: 1 glass of wine/wk Tobacco: no  Labs: Alessandra Grout, MD ; Urine: Leuks 2   Health Maintenance  Topic Date Due  . Tetanus/tdap  04/29/1979  . Colonoscopy  04/28/2010  . Influenza Vaccine  06/27/2014  . Mammogram  02/25/2016    Family History  Problem Relation Age of Onset  . Heart disease Father     Patient Active Problem List   Diagnosis Date Noted  . Leukocytopenia, unspecified 09/19/2013  . Anemia, unspecified 09/19/2013  . Multiple myeloma 10/05/2011    Past Medical History  Diagnosis Date  . Anemia   . Leukopenia 2001  . Monoclonal gammopathy 06/2011    Past Surgical History  Procedure Laterality Date  . History c-section    . Abdominal hysterectomy    . Bi-lateral salpingo oophorec  2008    Allergies: Review of patient's allergies indicates no known allergies.  Current Outpatient Prescriptions  Medication Sig Dispense Refill  . ferrous sulfate 325 (65 FE) MG tablet Take 325 mg by mouth daily.        Marland Kitchen FINACEA 15 % cream       . Sulfacetamide Sodium-Sulfur 10-2 % LIQD        No current facility-administered medications for this visit.    ROS: Pertinent items are noted in HPI.  Exam:    BP 110/78  Pulse 80  Resp 16  Ht $R'5\' 8"'Ik$  (1.727 m)  Wt 188 lb (85.276 kg)  BMI 28.59 kg/m2 Weight change: $RemoveBefore'@WEIGHTCHANGE'SpvuewCQwNOGy$ @ Last 3 height recordings:  Ht Readings from Last 3 Encounters:  04/27/14  $RemoveB'5\' 8"'nxnyIqqK$  (1.727 m)  03/19/14 $RemoveB'5\' 8"'KdWCMejz$  (1.727 m)  12/19/13 $RemoveB'5\' 8"'GImOZIZb$  (1.727 m)   General appearance: alert, cooperative and appears stated age Head: Normocephalic, without obvious abnormality, atraumatic Neck: no adenopathy, no carotid bruit, no JVD, supple, symmetrical, trachea midline and thyroid not enlarged, symmetric, no tenderness/mass/nodules Lungs: clear to auscultation bilaterally Breasts: normal appearance, no masses or tenderness Heart: regular rate and rhythm, S1, S2 normal, no murmur, click, rub or gallop Abdomen: soft, non-tender; bowel sounds normal; no masses,  no organomegaly Extremities: extremities normal, atraumatic, no cyanosis or edema Skin: Skin color, texture, turgor normal. No rashes or lesions Lymph nodes: Cervical, supraclavicular, and axillary nodes normal. no inguinal nodes palpated Neurologic: Grossly normal   Pelvic: External genitalia:  no lesions              Urethra: normal appearing urethra with no masses, tenderness or lesions              Bartholins and Skenes: Bartholin's, Urethra, Skene's normal                 Vagina: atrophic              Cervix: absent  Pap taken: no        Bimanual Exam:  Uterus: absent                                      Adnexa:    no masses                                      Rectovaginal: Confirms                                      Anus:  normal sphincter tone, no lesions  A: well woman Multiple myloma     P: mammogram Will ask oncology regarding DEXA- pt does get regular bone survey's for MM, never on HRT counseled on breast self exam, mammography screening, osteoporosis, adequate intake of calcium and vitamin D, diet and exercise return annually or prn Discussed PAP guideline changes, importance of weight bearing exercises, calcium, vit D and balanced diet.  An After Visit Summary was printed and given to the patient.

## 2014-06-16 ENCOUNTER — Telehealth: Payer: Self-pay | Admitting: Internal Medicine

## 2014-06-16 NOTE — Telephone Encounter (Signed)
lvm for pt regarding to r/s appt...advised pt to call back to r/s

## 2014-06-18 ENCOUNTER — Other Ambulatory Visit: Payer: Self-pay | Admitting: Medical Oncology

## 2014-06-18 ENCOUNTER — Telehealth: Payer: Self-pay | Admitting: Internal Medicine

## 2014-06-18 ENCOUNTER — Ambulatory Visit: Payer: 59

## 2014-06-18 ENCOUNTER — Other Ambulatory Visit: Payer: 59

## 2014-06-18 NOTE — Telephone Encounter (Signed)
s.w. pt she did not want to r/s at this time...will call back to r/s

## 2014-09-28 ENCOUNTER — Encounter: Payer: Self-pay | Admitting: Gynecology

## 2015-04-30 ENCOUNTER — Ambulatory Visit: Payer: 59 | Admitting: Gynecology

## 2015-12-10 ENCOUNTER — Other Ambulatory Visit: Payer: Self-pay

## 2015-12-10 DIAGNOSIS — Z1231 Encounter for screening mammogram for malignant neoplasm of breast: Secondary | ICD-10-CM

## 2015-12-22 ENCOUNTER — Ambulatory Visit
Admission: RE | Admit: 2015-12-22 | Discharge: 2015-12-22 | Disposition: A | Discharge status: Home / Self Care | Payer: BLUE CROSS/BLUE SHIELD | Source: Ambulatory Visit

## 2015-12-22 DIAGNOSIS — Z1231 Encounter for screening mammogram for malignant neoplasm of breast: Secondary | ICD-10-CM

## 2018-04-19 ENCOUNTER — Other Ambulatory Visit: Payer: Self-pay | Admitting: Family Medicine

## 2018-04-19 DIAGNOSIS — Z1231 Encounter for screening mammogram for malignant neoplasm of breast: Secondary | ICD-10-CM

## 2018-04-29 ENCOUNTER — Ambulatory Visit
Admission: RE | Admit: 2018-04-29 | Discharge: 2018-04-29 | Disposition: A | Discharge status: Home / Self Care | Payer: BLUE CROSS/BLUE SHIELD | Source: Ambulatory Visit | Attending: Family Medicine | Admitting: Family Medicine

## 2018-04-29 DIAGNOSIS — Z1231 Encounter for screening mammogram for malignant neoplasm of breast: Secondary | ICD-10-CM

## 2018-05-01 ENCOUNTER — Other Ambulatory Visit: Payer: Self-pay | Admitting: Family Medicine

## 2018-05-01 DIAGNOSIS — R928 Other abnormal and inconclusive findings on diagnostic imaging of breast: Secondary | ICD-10-CM

## 2018-05-10 ENCOUNTER — Ambulatory Visit: Payer: BLUE CROSS/BLUE SHIELD

## 2018-05-10 ENCOUNTER — Ambulatory Visit
Admission: RE | Admit: 2018-05-10 | Discharge: 2018-05-10 | Disposition: A | Discharge status: Home / Self Care | Payer: BLUE CROSS/BLUE SHIELD | Source: Ambulatory Visit | Attending: Family Medicine | Admitting: Family Medicine

## 2018-05-10 DIAGNOSIS — R928 Other abnormal and inconclusive findings on diagnostic imaging of breast: Secondary | ICD-10-CM

## 2022-01-18 ENCOUNTER — Telehealth: Payer: Self-pay | Admitting: Hematology and Oncology

## 2022-01-18 NOTE — Telephone Encounter (Signed)
Scheduled appt per 2/20 referral. Pt is aware of appt date and time. Pt is aware to arrive 15 mins prior to appt time and to bring and updated insurance card. Pt is aware of appt location.   °

## 2022-01-19 NOTE — Progress Notes (Signed)
[place] CONSULT NOTE  Patient Care Team: Jonathon Jordan, MD as PCP - General (Family Medicine)  CHIEF COMPLAINTS/PURPOSE OF CONSULTATION:  Newly diagnosed smoldering multiple myeloma  HISTORY OF PRESENTING ILLNESS:  [name] 61 y.o. female is here because of recent diagnosis of smoldering multiple myeloma. She presents to the clinic today for initial evaluation and discussion of treatment options.  She was originally diagnosed in 2012 with a bone marrow biopsy which showed 12% plasma cells.  She followed for a while and then lost to follow-up.  She was seen by her primary care physician who recommended that she follow-up with [place] for her smoldering myeloma.  She comes in today and feels really well without any new complaints or concerns.  I reviewed her records extensively and collaborated the history with the patient.  MEDICAL HISTORY:  Past Medical History:  Diagnosis Date   Anemia    Leukopenia 2001   Monoclonal gammopathy 06/2011    SURGICAL HISTORY: Past Surgical History:  Procedure Laterality Date   ABDOMINAL HYSTERECTOMY     BILATERAL SALPINGOOPHORECTOMY  2008   history c-section      SOCIAL HISTORY: Social History   Socioeconomic History   Marital status: Divorced    Spouse name: Not on file   Number of children: Not on file   Years of education: Not on file   Highest education level: Not on file  Occupational History   Not on file  Tobacco Use   Smoking status: Never   Smokeless tobacco: Never  Substance and Sexual Activity   Alcohol use: Yes    Alcohol/week: 1.0 standard drink    Types: 1 Glasses of wine per week   Drug use: No   Sexual activity: Yes    Partners: Male    Birth control/protection: Surgical    Comment: Hysterectomy  Other Topics Concern   Not on file  Social History Narrative   Not on file   Social Determinants of Health   Financial Resource Strain: Not on file  Food Insecurity: Not on file   Transportation Needs: Not on file  Physical Activity: Not on file  Stress: Not on file  Social Connections: Not on file  Intimate Partner Violence: Not on file    FAMILY HISTORY: Family History  Problem Relation Age of Onset   Heart disease Father     ALLERGIES:  has No Known Allergies.  MEDICATIONS:  Current Outpatient Medications  Medication Sig Dispense Refill   ferrous sulfate 325 (65 FE) MG tablet Take 325 mg by mouth daily.       FINACEA 15 % cream      Sulfacetamide Sodium-Sulfur 10-2 % LIQD      No current facility-administered medications for this visit.    REVIEW OF SYSTEMS:   Constitutional: Denies fevers, chills or abnormal night sweats   All other systems were reviewed with the patient and are negative.  PHYSICAL EXAMINATION: ECOG PERFORMANCE STATUS: 1 - Symptomatic but completely ambulatory  Vitals:   01/20/22 1516  BP: (!) 168/82  Pulse: 98  Resp: 18  Temp: 97.7 F (36.5 C)  SpO2: 98%   Filed Weights   01/20/22 1516  Weight: 181 lb 3.2 oz (82.2 kg)    GENERAL:alert, no distress and comfortable   NEURO: no focal motor/sensory deficits   LABORATORY DATA:  I have reviewed the data as listed Lab Results  Component Value Date   WBC 2.8 (L) 03/19/2014   HGB 11.4 (L) 03/19/2014  HCT 35.0 03/19/2014   MCV 89.1 03/19/2014   PLT 275 03/19/2014   Lab Results  Component Value Date   NA 142 03/19/2014   K 3.8 03/19/2014   CL 105 10/05/2011   CO2 24 03/19/2014    RADIOGRAPHIC STUDIES: I have personally reviewed the radiological reports and agreed with the findings in the report.  ASSESSMENT AND PLAN:  Multiple myeloma ([place]) Smoldering myeloma: Diagnosed by Dr. Juliann Mule Bone marrow biopsy 08/11/2011: Plasma cells 12% IgG kappa MRI pelvis 09/01/2011: Abnormality left ischium is artifactual.  No bone lesions 09/17/2013: Bone survey: Solitary focal lytic lesion left inferior pubic ramus unchanged  12/16/2013: M protein 1.64 g, normal kappa  lambda ratio, IgG 2140, IgM 5, hemoglobin 11.2 02/06/2019: M protein 1.4 g, hemoglobin 11.7 02/23/2020: Hemoglobin 11.9, creatinine, calcium 9.3, albumin 4.3  Plan: 1.  Recheck CBC, CMP, SPEP, kappa lambda ratio, beta-2 microglobulin 2. bone survey for evaluation for myeloma 3.  History of iron deficiency anemia: We will check iron studies and ferritin as well  Return to clinic in 1 week to discuss results I recommended that we do annual blood work for monitoring for myeloma.  All questions were answered. The patient knows to call the clinic with any problems, questions or concerns.   Rulon Eisenmenger, MD, MPH 01/20/2022    I, Thana Ates, am acting as scribe for Nicholas Lose, MD.  I have reviewed the above documentation for accuracy and completeness, and I agree with the above.

## 2022-01-20 ENCOUNTER — Other Ambulatory Visit: Payer: Self-pay

## 2022-01-20 ENCOUNTER — Inpatient Hospital Stay: Payer: BLUE CROSS/BLUE SHIELD | Attending: Hematology and Oncology | Admitting: Hematology and Oncology

## 2022-01-20 ENCOUNTER — Inpatient Hospital Stay: Payer: BLUE CROSS/BLUE SHIELD

## 2022-01-20 VITALS — BP 168/82 | HR 98 | Temp 97.7°F | Resp 18 | Ht 68.0 in | Wt 181.2 lb

## 2022-01-20 DIAGNOSIS — D472 Monoclonal gammopathy: Secondary | ICD-10-CM | POA: Diagnosis not present

## 2022-01-20 DIAGNOSIS — C9000 Multiple myeloma not having achieved remission: Secondary | ICD-10-CM

## 2022-01-20 DIAGNOSIS — Z862 Personal history of diseases of the blood and blood-forming organs and certain disorders involving the immune mechanism: Secondary | ICD-10-CM | POA: Diagnosis not present

## 2022-01-20 LAB — CBC WITH DIFFERENTIAL/PLATELET
Abs Immature Granulocytes: 0 10*3/uL (ref 0.00–0.07)
Basophils Absolute: 0 10*3/uL (ref 0.0–0.1)
Basophils Relative: 1 %
Eosinophils Absolute: 0 10*3/uL (ref 0.0–0.5)
Eosinophils Relative: 0 %
HCT: 34.2 % — ABNORMAL LOW (ref 36.0–46.0)
Hemoglobin: 11.2 g/dL — ABNORMAL LOW (ref 12.0–15.0)
Immature Granulocytes: 0 %
Lymphocytes Relative: 42 %
Lymphs Abs: 1.6 10*3/uL (ref 0.7–4.0)
MCH: 29.8 pg (ref 26.0–34.0)
MCHC: 32.7 g/dL (ref 30.0–36.0)
MCV: 91 fL (ref 80.0–100.0)
Monocytes Absolute: 0.4 10*3/uL (ref 0.1–1.0)
Monocytes Relative: 10 %
Neutro Abs: 1.9 10*3/uL (ref 1.7–7.7)
Neutrophils Relative %: 47 %
Platelets: 244 10*3/uL (ref 150–400)
RBC: 3.76 MIL/uL — ABNORMAL LOW (ref 3.87–5.11)
RDW: 13.2 % (ref 11.5–15.5)
WBC: 3.9 10*3/uL — ABNORMAL LOW (ref 4.0–10.5)
nRBC: 0 % (ref 0.0–0.2)

## 2022-01-20 NOTE — Addendum Note (Signed)
Addended by: Suzzette Righter on: 01/20/2022 04:32 PM   Modules accepted: Orders

## 2022-01-20 NOTE — Addendum Note (Signed)
Addended by: Gardiner Coins L on: 01/20/2022 04:00 PM   Modules accepted: Orders

## 2022-01-20 NOTE — Assessment & Plan Note (Signed)
Smoldering myeloma: Diagnosed by Dr. Juliann Mule Bone marrow biopsy 08/11/2011: Plasma cells 12% IgG kappa MRI pelvis 09/01/2011: Abnormality left ischium is artifactual.  No bone lesions 09/17/2013: Bone survey: Solitary focal lytic lesion left inferior pubic ramus unchanged  12/16/2013: M protein 1.64 g, normal kappa lambda ratio, IgG 2140, IgM 5, hemoglobin 11.2 02/06/2019: M protein 1.4 g, hemoglobin 11.7 02/23/2020: Hemoglobin 11.9, creatinine, calcium 9.3, albumin 4.3  Plan: 1.  Recheck CBC, CMP, SPEP, kappa lambda ratio, beta-2 microglobulin, LDH 2. PET CT scan for evaluation for myeloma  Return to clinic in 1 week to discuss results

## 2022-01-23 LAB — FERRITIN: Ferritin: 180 ng/mL (ref 11–307)

## 2022-01-24 NOTE — Addendum Note (Signed)
Addended by: Suzzette Righter on: 01/24/2022 09:26 AM   Modules accepted: Orders

## 2022-01-27 NOTE — Progress Notes (Signed)
?  HEMATOLOGY-ONCOLOGY TELEPHONE VISIT PROGRESS NOTE ? ?I connected with [name] on 01/30/2022 at  3:30 PM EST by telephone and verified that I am speaking with the correct person using two identifiers.  ?I discussed the limitations, risks, security and privacy concerns of performing an evaluation and management service by telephone and the availability of in person appointments.  ?I also discussed with the patient that there may be a patient responsible charge related to this service. The patient expressed understanding and agreed to proceed.  ? ?History of Present Illness: [name] is a 61 y.o. female with above-mentioned history of smoldering multiple myeloma. She presents via telephone today for follow-up.  We performed iron studies and CBC last week.  Unfortunately multiple myeloma labs were not performed and therefore I was not able to discuss these results with the patient. ?  ?Assessment Plan:  ?Multiple myeloma ([place]) ?  ?Smoldering myeloma: Diagnosed by Dr. Juliann Mule ?Bone marrow biopsy 08/11/2011: Plasma cells 12% IgG kappa ?MRI pelvis 09/01/2011: Abnormality left ischium is artifactual.  No bone lesions ?09/17/2013: Bone survey: Solitary focal lytic lesion left inferior pubic ramus unchanged ?  ?12/16/2013: M protein 1.64 g, normal kappa lambda ratio, IgG 2140, IgM 5, hemoglobin 11.2 ?02/06/2019: M protein 1.4 g, hemoglobin 11.7 ?02/23/2020: Hemoglobin 11.9, creatinine, calcium 9.3, albumin 4.3 ?01/20/2022: Hemoglobin 11.2, WBC 3.9, ferritin 180 ?  ?Plan: ?1.  Recheck CBC, CMP, SPEP, kappa lambda ratio, beta-2 microglobulin ?2. bone survey for evaluation for myeloma ?  ?On 01/20/2022, myeloma labs were not performed. ?Therefore I will perform these labs on next Thursday at 4 PM. ?I would like to speak to her 1 week after the lab test to go over the results. ? ? ? ?I discussed the assessment and treatment plan with the patient. The patient was provided an opportunity to ask questions and all were  answered. The patient agreed with the plan and demonstrated an understanding of the instructions. The patient was advised to call back or seek an in-person evaluation if the symptoms worsen or if the condition fails to improve as anticipated.  ? ?Total time spent: 30 mins including non-face to face time and time spent for planning, charting and coordination of care ? ?Rulon Eisenmenger, MD ?01/30/2022  ? ? I, Thana Ates, am acting as scribe for Nicholas Lose, MD. ? ?I have reviewed the above documentation for accuracy and completeness, and I agree with the above. ?  ? ?

## 2022-01-30 ENCOUNTER — Inpatient Hospital Stay: Payer: 59 | Attending: Hematology and Oncology | Admitting: Hematology and Oncology

## 2022-01-30 DIAGNOSIS — C9000 Multiple myeloma not having achieved remission: Secondary | ICD-10-CM | POA: Diagnosis not present

## 2022-01-30 NOTE — Assessment & Plan Note (Signed)
Multiple myeloma ([name]) ?Smoldering myeloma: Diagnosed by Dr. Juliann Mule ?Bone marrow biopsy 08/11/2011: Plasma cells 12% IgG kappa ?MRI pelvis 09/01/2011: Abnormality left ischium is artifactual.  No bone lesions ?09/17/2013: Bone survey: Solitary focal lytic lesion left inferior pubic ramus unchanged ?? ?12/16/2013: M protein 1.64 g, normal kappa lambda ratio, IgG 2140, IgM 5, hemoglobin 11.2 ?02/06/2019: M protein 1.4 g, hemoglobin 11.7 ?02/23/2020: Hemoglobin 11.9, creatinine, calcium 9.3, albumin 4.3 ?01/20/2022: Hemoglobin 11.2, WBC 3.9, ferritin 180 ?? ?Plan: ?1.  Recheck CBC, CMP, SPEP, kappa lambda ratio, beta-2 microglobulin ?2. bone survey for evaluation for myeloma ?3.  History of iron deficiency anemia: We will check iron studies and ferritin as well ?

## 2022-01-31 ENCOUNTER — Telehealth: Payer: Self-pay | Admitting: Hematology and Oncology

## 2022-01-31 NOTE — Telephone Encounter (Signed)
Scheduled appointment per 3/6 los. Patient is aware. ?

## 2022-02-02 ENCOUNTER — Other Ambulatory Visit: Payer: Self-pay

## 2022-02-02 ENCOUNTER — Inpatient Hospital Stay: Payer: 59

## 2022-02-02 DIAGNOSIS — C9000 Multiple myeloma not having achieved remission: Secondary | ICD-10-CM | POA: Diagnosis present

## 2022-02-02 LAB — CBC WITH DIFFERENTIAL/PLATELET
Abs Immature Granulocytes: 0 10*3/uL (ref 0.00–0.07)
Basophils Absolute: 0 10*3/uL (ref 0.0–0.1)
Basophils Relative: 1 %
Eosinophils Absolute: 0 10*3/uL (ref 0.0–0.5)
Eosinophils Relative: 1 %
HCT: 35.1 % — ABNORMAL LOW (ref 36.0–46.0)
Hemoglobin: 11.4 g/dL — ABNORMAL LOW (ref 12.0–15.0)
Immature Granulocytes: 0 %
Lymphocytes Relative: 43 %
Lymphs Abs: 1.3 10*3/uL (ref 0.7–4.0)
MCH: 29.8 pg (ref 26.0–34.0)
MCHC: 32.5 g/dL (ref 30.0–36.0)
MCV: 91.6 fL (ref 80.0–100.0)
Monocytes Absolute: 0.4 10*3/uL (ref 0.1–1.0)
Monocytes Relative: 12 %
Neutro Abs: 1.3 10*3/uL — ABNORMAL LOW (ref 1.7–7.7)
Neutrophils Relative %: 43 %
Platelets: 234 10*3/uL (ref 150–400)
RBC: 3.83 MIL/uL — ABNORMAL LOW (ref 3.87–5.11)
RDW: 13.5 % (ref 11.5–15.5)
WBC: 3.1 10*3/uL — ABNORMAL LOW (ref 4.0–10.5)
nRBC: 0 % (ref 0.0–0.2)

## 2022-02-02 LAB — CMP (CANCER CENTER ONLY)
ALT: 10 U/L (ref 0–44)
AST: 14 U/L — ABNORMAL LOW (ref 15–41)
Albumin: 4.2 g/dL (ref 3.5–5.0)
Alkaline Phosphatase: 46 U/L (ref 38–126)
Anion gap: 6 (ref 5–15)
BUN: 13 mg/dL (ref 8–23)
CO2: 27 mmol/L (ref 22–32)
Calcium: 9.1 mg/dL (ref 8.9–10.3)
Chloride: 105 mmol/L (ref 98–111)
Creatinine: 0.63 mg/dL (ref 0.44–1.00)
GFR, Estimated: >60 mL/min (ref >60)
Glucose, Bld: 93 mg/dL (ref 70–99)
Potassium: 4.2 mmol/L (ref 3.5–5.1)
Sodium: 138 mmol/L (ref 135–145)
Total Bilirubin: 0.5 mg/dL (ref 0.3–1.2)
Total Protein: 7.4 g/dL (ref 6.5–8.1)

## 2022-02-03 LAB — KAPPA/LAMBDA LIGHT CHAINS
Kappa free light chain: 22.8 mg/L — ABNORMAL HIGH (ref 3.3–19.4)
Kappa, lambda light chain ratio: 3.93 — ABNORMAL HIGH (ref 0.26–1.65)
Lambda free light chains: 5.8 mg/L (ref 5.7–26.3)

## 2022-02-06 LAB — MULTIPLE MYELOMA PANEL, SERUM
Albumin SerPl Elph-Mcnc: 3.8 g/dL (ref 2.9–4.4)
Albumin/Glob SerPl: 1.2 (ref 0.7–1.7)
Alpha 1: 0.2 g/dL (ref 0.0–0.4)
Alpha2 Glob SerPl Elph-Mcnc: 0.6 g/dL (ref 0.4–1.0)
B-Globulin SerPl Elph-Mcnc: 0.7 g/dL (ref 0.7–1.3)
Gamma Glob SerPl Elph-Mcnc: 1.8 g/dL (ref 0.4–1.8)
Globulin, Total: 3.3 g/dL (ref 2.2–3.9)
IgA: 83 mg/dL — ABNORMAL LOW (ref 87–352)
IgG (Immunoglobin G), Serum: 2085 mg/dL — ABNORMAL HIGH (ref 586–1602)
IgM (Immunoglobulin M), Srm: 5 mg/dL — ABNORMAL LOW (ref 26–217)
M Protein SerPl Elph-Mcnc: 1.3 g/dL — ABNORMAL HIGH
Total Protein ELP: 7.1 g/dL (ref 6.0–8.5)

## 2022-02-07 NOTE — Progress Notes (Signed)
HEMATOLOGY-ONCOLOGY TELEPHONE VISIT PROGRESS NOTE ? ?I connected with _0 @ on 02/09/22 at  3:00 PM EDT by telephone and verified that I am speaking with the correct person using two identifiers.  ?I discussed the limitations, risks, security and privacy concerns of performing an evaluation and management service by telephone and the availability of in person appointments.  ?I also discussed with the patient that there may be a patient responsible charge related to this service. The patient expressed understanding and agreed to proceed.  ?  ?CHIEF COMPLAINTS smoldering multiple myeloma ? ?History of Present Illness: [name] is a 61 y.o. female with above-mentioned history of smoldering multiple myeloma. She presents via telephone today for follow-up.  ? ?REVIEW OF SYSTEMS:   ?Constitutional: Denies fevers, chills or abnormal weight loss ?All other systems were reviewed with the patient and are negative. ?Observations/Objective:  ? ?  ?Assessment Plan:  ?Multiple myeloma ([place]) ?  ?Smoldering myeloma: Diagnosed by Dr. Juliann Mule ?Bone marrow biopsy 08/11/2011: Plasma cells 12% IgG kappa ?MRI pelvis 09/01/2011: Abnormality left ischium is artifactual.  No bone lesions ?09/17/2013: Bone survey: Solitary focal lytic lesion left inferior pubic ramus unchanged ?  ?12/16/2013: M protein 1.64 g, normal kappa lambda ratio, IgG 2140, IgM 5, hemoglobin 11.2 ?02/06/2019: M protein 1.4 g, hemoglobin 11.7 ?02/23/2020: Hemoglobin 11.9, creatinine, calcium 9.3, albumin 4.3 ?01/20/2022: Hemoglobin 11.2, WBC 3.9, ferritin 180  ?02/02/2022: Hemoglobin 11.4, Kappa 22.8, lambda 5.8, ratio 3.93, IgG 2085, M protein 1.3 g, creatinine 0.63, calcium 9.1, albumin 4.2 ?  ?Plan: ?1.  Based on stable M protein levels there does not appear to be any need for additional work-up at this time. ?2. bone survey for evaluation for myeloma:   02/09/2022: It has not been officially read.  To my review it looks normal. ?  ?Return to clinic in 6 months  with labs and follow-up ? ? ?I discussed the assessment and treatment plan with the patient. The patient was provided an opportunity to ask questions and all were answered. The patient agreed with the plan and demonstrated an understanding of the instructions. The patient was advised to call back or seek an in-person evaluation if the symptoms worsen or if the condition fails to improve as anticipated.  ? ?I provided 12 minutes of non-face-to-face time during this encounter. Harriette Ohara, MD   ? ?Earlie Server, am acting as a Education administrator for Dr. Lindi Adie  ? ?I have reviewed the above documentation for accuracy and completeness, and I agree with the above. ? ?

## 2022-02-08 ENCOUNTER — Ambulatory Visit (HOSPITAL_COMMUNITY): Payer: 59

## 2022-02-09 ENCOUNTER — Inpatient Hospital Stay (HOSPITAL_BASED_OUTPATIENT_CLINIC_OR_DEPARTMENT_OTHER): Payer: 59 | Admitting: Hematology and Oncology

## 2022-02-09 ENCOUNTER — Ambulatory Visit (HOSPITAL_COMMUNITY)
Admission: RE | Admit: 2022-02-09 | Discharge: 2022-02-09 | Disposition: A | Discharge status: Home / Self Care | Payer: 59 | Source: Ambulatory Visit | Attending: Hematology and Oncology | Admitting: Hematology and Oncology

## 2022-02-09 ENCOUNTER — Other Ambulatory Visit: Payer: Self-pay

## 2022-02-09 DIAGNOSIS — C9000 Multiple myeloma not having achieved remission: Secondary | ICD-10-CM | POA: Diagnosis present

## 2022-02-09 NOTE — Assessment & Plan Note (Signed)
?  Smoldering myeloma: Diagnosed by Dr. Juliann Mule ?Bone marrow biopsy 08/11/2011: Plasma cells 12% IgG kappa ?MRI pelvis 09/01/2011: Abnormality left ischium is artifactual. ?No bone lesions ?09/17/2013: Bone survey: Solitary focal lytic lesion left inferior pubic ramus unchanged ?? ?12/16/2013: M protein 1.64 g, normal kappa lambda ratio, IgG 2140, IgM 5, hemoglobin 11.2 ?02/06/2019: M protein 1.4 g, hemoglobin 11.7 ?02/23/2020: Hemoglobin 11.9, creatinine, calcium 9.3, albumin 4.3 ?01/20/2022: Hemoglobin 11.2, WBC 3.9, ferritin 180  ?02/02/2022: Hemoglobin 11.4, Kappa 22.8, lambda 5.8, ratio 3.93, IgG 2085, M protein 1.3 g, creatinine 0.63, calcium 9.1, albumin 4.2 ?? ?Plan: ?1.??Based on stable M protein levels there does not appear to be any need for additional work-up at this time. ?2.?bone survey?for evaluation for myeloma: Being done 02/09/2022 ?  ?Return to clinic in 6 months with labs and follow-up ?

## 2022-02-10 ENCOUNTER — Telehealth: Payer: Self-pay | Admitting: Hematology and Oncology

## 2022-02-10 NOTE — Telephone Encounter (Signed)
Scheduled appointment per 3/16 los. Patient is aware. Patient will be mailed an updated calendar. ?

## 2022-02-15 ENCOUNTER — Ambulatory Visit (HOSPITAL_COMMUNITY): Payer: 59

## 2023-02-06 ENCOUNTER — Telehealth: Payer: Self-pay | Admitting: Family Medicine

## 2023-02-06 NOTE — Telephone Encounter (Signed)
Reached out to schedule patient per Lindi Adie being out of office, left voicemail of date and time of appointment.

## 2023-02-12 ENCOUNTER — Inpatient Hospital Stay: Payer: 59 | Attending: Hematology and Oncology

## 2023-02-12 ENCOUNTER — Other Ambulatory Visit: Payer: Self-pay | Admitting: *Deleted

## 2023-02-12 ENCOUNTER — Other Ambulatory Visit: Payer: Self-pay

## 2023-02-12 DIAGNOSIS — C9000 Multiple myeloma not having achieved remission: Secondary | ICD-10-CM | POA: Diagnosis present

## 2023-02-12 LAB — CMP (CANCER CENTER ONLY)
ALT: 12 U/L (ref 0–44)
AST: 16 U/L (ref 15–41)
Albumin: 4.2 g/dL (ref 3.5–5.0)
Alkaline Phosphatase: 41 U/L (ref 38–126)
Anion gap: 7 (ref 5–15)
BUN: 13 mg/dL (ref 8–23)
CO2: 27 mmol/L (ref 22–32)
Calcium: 8.8 mg/dL — ABNORMAL LOW (ref 8.9–10.3)
Chloride: 104 mmol/L (ref 98–111)
Creatinine: 0.68 mg/dL (ref 0.44–1.00)
GFR, Estimated: >60 mL/min (ref >60)
Glucose, Bld: 89 mg/dL (ref 70–99)
Potassium: 3.8 mmol/L (ref 3.5–5.1)
Sodium: 138 mmol/L (ref 135–145)
Total Bilirubin: 0.5 mg/dL (ref 0.3–1.2)
Total Protein: 7.7 g/dL (ref 6.5–8.1)

## 2023-02-12 LAB — CBC WITH DIFFERENTIAL (CANCER CENTER ONLY)
Abs Immature Granulocytes: 0.01 10*3/uL (ref 0.00–0.07)
Basophils Absolute: 0 10*3/uL (ref 0.0–0.1)
Basophils Relative: 1 %
Eosinophils Absolute: 0 10*3/uL (ref 0.0–0.5)
Eosinophils Relative: 1 %
HCT: 34.5 % — ABNORMAL LOW (ref 36.0–46.0)
Hemoglobin: 11.3 g/dL — ABNORMAL LOW (ref 12.0–15.0)
Immature Granulocytes: 0 %
Lymphocytes Relative: 43 %
Lymphs Abs: 1.6 10*3/uL (ref 0.7–4.0)
MCH: 30.3 pg (ref 26.0–34.0)
MCHC: 32.8 g/dL (ref 30.0–36.0)
MCV: 92.5 fL (ref 80.0–100.0)
Monocytes Absolute: 0.4 10*3/uL (ref 0.1–1.0)
Monocytes Relative: 10 %
Neutro Abs: 1.7 10*3/uL (ref 1.7–7.7)
Neutrophils Relative %: 45 %
Platelet Count: 245 10*3/uL (ref 150–400)
RBC: 3.73 MIL/uL — ABNORMAL LOW (ref 3.87–5.11)
RDW: 13.8 % (ref 11.5–15.5)
WBC Count: 3.7 10*3/uL — ABNORMAL LOW (ref 4.0–10.5)
nRBC: 0 % (ref 0.0–0.2)

## 2023-02-13 LAB — BETA 2 MICROGLOBULIN, SERUM: Beta-2 Microglobulin: 0.9 mg/L (ref 0.6–2.4)

## 2023-02-13 LAB — KAPPA/LAMBDA LIGHT CHAINS
Kappa free light chain: 20.5 mg/L — ABNORMAL HIGH (ref 3.3–19.4)
Kappa, lambda light chain ratio: 2.73 — ABNORMAL HIGH (ref 0.26–1.65)
Lambda free light chains: 7.5 mg/L (ref 5.7–26.3)

## 2023-02-14 ENCOUNTER — Encounter: Payer: Self-pay | Admitting: Hematology and Oncology

## 2023-02-16 LAB — MULTIPLE MYELOMA PANEL, SERUM
Albumin SerPl Elph-Mcnc: 3.8 g/dL (ref 2.9–4.4)
Albumin/Glob SerPl: 1.2 (ref 0.7–1.7)
Alpha 1: 0.2 g/dL (ref 0.0–0.4)
Alpha2 Glob SerPl Elph-Mcnc: 0.6 g/dL (ref 0.4–1.0)
B-Globulin SerPl Elph-Mcnc: 0.8 g/dL (ref 0.7–1.3)
Gamma Glob SerPl Elph-Mcnc: 1.7 g/dL (ref 0.4–1.8)
Globulin, Total: 3.3 g/dL (ref 2.2–3.9)
IgA: 89 mg/dL (ref 87–352)
IgG (Immunoglobin G), Serum: 2019 mg/dL — ABNORMAL HIGH (ref 586–1602)
IgM (Immunoglobulin M), Srm: <5 mg/dL — ABNORMAL LOW (ref 26–217)
M Protein SerPl Elph-Mcnc: 1.4 g/dL — ABNORMAL HIGH
Total Protein ELP: 7.1 g/dL (ref 6.0–8.5)

## 2023-02-19 ENCOUNTER — Telehealth: Payer: 59 | Admitting: Hematology and Oncology

## 2023-03-07 NOTE — Progress Notes (Signed)
HEMATOLOGY-ONCOLOGY TELEPHONE VISIT PROGRESS NOTE  I connected with our patient on 03/08/23 at  3:00 PM EDT by telephone and verified that I am speaking with the correct person using two identifiers.  I discussed the limitations, risks, security and privacy concerns of performing an evaluation and management service by telephone and the availability of in person appointments.  I also discussed with the patient that there may be a patient responsible charge related to this service. The patient expressed understanding and agreed to proceed.   History of Present Illness: [name] is a 62 y.o. female with above-mentioned history of smoldering multiple myeloma. She presents via telephone today for follow-up to discuss lab results.  She reports no new problems or concerns.  REVIEW OF SYSTEMS:   Constitutional: Denies fevers, chills or abnormal weight loss All other systems were reviewed with the patient and are negative. Observations/Objective:     Assessment Plan:  Multiple myeloma ([facility]) Smoldering myeloma: Diagnosed by Dr. Rosie Fate Bone marrow biopsy 08/11/2011: Plasma cells 12% IgG kappa MRI pelvis 09/01/2011: Abnormality left ischium is artifactual.  No bone lesions 09/17/2013: Bone survey: Solitary focal lytic lesion left inferior pubic ramus unchanged   12/16/2013: M protein 1.64 g, normal kappa lambda ratio, IgG 2140, IgM 5, hemoglobin 11.2 02/06/2019: M protein 1.4 g, hemoglobin 11.7 02/23/2020: Hemoglobin 11.9, creatinine, calcium 9.3, albumin 4.3 01/20/2022: Hemoglobin 11.2, WBC 3.9, ferritin 180  02/02/2022: Hemoglobin 11.4, Kappa 22.8, lambda 5.8, ratio 3.93, IgG 2085, M protein 1.3 g, creatinine 0.63, calcium 9.1, albumin 4.2 02/12/2023: IgG 2019, M protein 1.4 g IgG kappa, creatinine 0.68, albumin 4.2, calcium 8.8, hemoglobin 11.3  Labs appear to be fairly stable without any worsening of organ function Continue to watch and monitor with rechecking of labs again in 1 year and  telephone visit 1 week later   I discussed the assessment and treatment plan with the patient. The patient was provided an opportunity to ask questions and all were answered. The patient agreed with the plan and demonstrated an understanding of the instructions. The patient was advised to call back or seek an in-person evaluation if the symptoms worsen or if the condition fails to improve as anticipated.   I provided 12 minutes of non-face-to-face time during this encounter.  This includes time for charting and coordination of care   Tamsen Meek, MD  I Janan Ridge am acting as a scribe for Dr.Vinay Gudena  I have reviewed the above documentation for accuracy and completeness, and I agree with the above.

## 2023-03-08 ENCOUNTER — Inpatient Hospital Stay: Payer: 59 | Attending: Hematology and Oncology | Admitting: Hematology and Oncology

## 2023-03-08 DIAGNOSIS — C9000 Multiple myeloma not having achieved remission: Secondary | ICD-10-CM | POA: Diagnosis not present

## 2023-03-08 NOTE — Assessment & Plan Note (Signed)
Smoldering myeloma: Diagnosed by Dr. Rosie Fate Bone marrow biopsy 08/11/2011: Plasma cells 12% IgG kappa MRI pelvis 09/01/2011: Abnormality left ischium is artifactual.  No bone lesions 09/17/2013: Bone survey: Solitary focal lytic lesion left inferior pubic ramus unchanged   12/16/2013: M protein 1.64 g, normal kappa lambda ratio, IgG 2140, IgM 5, hemoglobin 11.2 02/06/2019: M protein 1.4 g, hemoglobin 11.7 02/23/2020: Hemoglobin 11.9, creatinine, calcium 9.3, albumin 4.3 01/20/2022: Hemoglobin 11.2, WBC 3.9, ferritin 180  02/02/2022: Hemoglobin 11.4, Kappa 22.8, lambda 5.8, ratio 3.93, IgG 2085, M protein 1.3 g, creatinine 0.63, calcium 9.1, albumin 4.2 02/12/2023: IgG 2019, M protein 1.4 g IgG kappa, creatinine 0.68, albumin 4.2, calcium 8.8, hemoglobin 11.3  Labs appear to be fairly stable without any worsening of organ function Continue to watch and monitor with rechecking of labs again in 1 year and telephone visit 1 week later

## 2023-03-09 ENCOUNTER — Telehealth: Payer: Self-pay | Admitting: Hematology and Oncology

## 2023-03-09 NOTE — Telephone Encounter (Signed)
Spoke with patient confirming upcoming appointments  

## 2023-09-07 ENCOUNTER — Other Ambulatory Visit: Payer: Self-pay | Admitting: Family Medicine

## 2023-09-07 DIAGNOSIS — Z1231 Encounter for screening mammogram for malignant neoplasm of breast: Secondary | ICD-10-CM

## 2023-09-12 ENCOUNTER — Ambulatory Visit
Admission: RE | Admit: 2023-09-12 | Discharge: 2023-09-12 | Disposition: A | Discharge status: Home / Self Care | Payer: 59 | Source: Ambulatory Visit | Attending: Family Medicine | Admitting: Family Medicine

## 2023-09-12 DIAGNOSIS — Z1231 Encounter for screening mammogram for malignant neoplasm of breast: Secondary | ICD-10-CM

## 2024-03-10 ENCOUNTER — Other Ambulatory Visit: Payer: Self-pay | Admitting: *Deleted

## 2024-03-10 ENCOUNTER — Inpatient Hospital Stay: Payer: 59 | Attending: Internal Medicine

## 2024-03-10 DIAGNOSIS — C9000 Multiple myeloma not having achieved remission: Secondary | ICD-10-CM

## 2024-03-10 DIAGNOSIS — D472 Monoclonal gammopathy: Secondary | ICD-10-CM | POA: Insufficient documentation

## 2024-03-10 LAB — CMP (CANCER CENTER ONLY)
ALT: 11 U/L (ref 0–44)
AST: 16 U/L (ref 15–41)
Albumin: 4.1 g/dL (ref 3.5–5.0)
Alkaline Phosphatase: 47 U/L (ref 38–126)
Anion gap: 3 — ABNORMAL LOW (ref 5–15)
BUN: 13 mg/dL (ref 8–23)
CO2: 30 mmol/L (ref 22–32)
Calcium: 9 mg/dL (ref 8.9–10.3)
Chloride: 106 mmol/L (ref 98–111)
Creatinine: 0.7 mg/dL (ref 0.44–1.00)
GFR, Estimated: >60 mL/min (ref >60)
Glucose, Bld: 90 mg/dL (ref 70–99)
Potassium: 3.9 mmol/L (ref 3.5–5.1)
Sodium: 139 mmol/L (ref 135–145)
Total Bilirubin: 0.4 mg/dL (ref 0.0–1.2)
Total Protein: 7.4 g/dL (ref 6.5–8.1)

## 2024-03-10 LAB — CBC WITH DIFFERENTIAL (CANCER CENTER ONLY)
Abs Immature Granulocytes: 0 10*3/uL (ref 0.00–0.07)
Basophils Absolute: 0 10*3/uL (ref 0.0–0.1)
Basophils Relative: 1 %
Eosinophils Absolute: 0 10*3/uL (ref 0.0–0.5)
Eosinophils Relative: 1 %
HCT: 32.4 % — ABNORMAL LOW (ref 36.0–46.0)
Hemoglobin: 10.7 g/dL — ABNORMAL LOW (ref 12.0–15.0)
Immature Granulocytes: 0 %
Lymphocytes Relative: 44 %
Lymphs Abs: 1.9 10*3/uL (ref 0.7–4.0)
MCH: 29.9 pg (ref 26.0–34.0)
MCHC: 33 g/dL (ref 30.0–36.0)
MCV: 90.5 fL (ref 80.0–100.0)
Monocytes Absolute: 0.4 10*3/uL (ref 0.1–1.0)
Monocytes Relative: 9 %
Neutro Abs: 2 10*3/uL (ref 1.7–7.7)
Neutrophils Relative %: 45 %
Platelet Count: 201 10*3/uL (ref 150–400)
RBC: 3.58 MIL/uL — ABNORMAL LOW (ref 3.87–5.11)
RDW: 13.2 % (ref 11.5–15.5)
WBC Count: 4.3 10*3/uL (ref 4.0–10.5)
nRBC: 0 % (ref 0.0–0.2)

## 2024-03-11 LAB — KAPPA/LAMBDA LIGHT CHAINS
Kappa free light chain: 18.6 mg/L (ref 3.3–19.4)
Kappa, lambda light chain ratio: 4.04 — ABNORMAL HIGH (ref 0.26–1.65)
Lambda free light chains: 4.6 mg/L — ABNORMAL LOW (ref 5.7–26.3)

## 2024-03-12 LAB — MULTIPLE MYELOMA PANEL, SERUM
Albumin SerPl Elph-Mcnc: 3.8 g/dL (ref 2.9–4.4)
Albumin/Glob SerPl: 1.2 (ref 0.7–1.7)
Alpha 1: 0.2 g/dL (ref 0.0–0.4)
Alpha2 Glob SerPl Elph-Mcnc: 0.5 g/dL (ref 0.4–1.0)
B-Globulin SerPl Elph-Mcnc: 0.8 g/dL (ref 0.7–1.3)
Gamma Glob SerPl Elph-Mcnc: 1.7 g/dL (ref 0.4–1.8)
Globulin, Total: 3.2 g/dL (ref 2.2–3.9)
IgA: 73 mg/dL — ABNORMAL LOW (ref 87–352)
IgG (Immunoglobin G), Serum: 2174 mg/dL — ABNORMAL HIGH (ref 586–1602)
IgM (Immunoglobulin M), Srm: <5 mg/dL — ABNORMAL LOW (ref 26–217)
M Protein SerPl Elph-Mcnc: 1.2 g/dL — ABNORMAL HIGH
Total Protein ELP: 7 g/dL (ref 6.0–8.5)

## 2024-03-17 ENCOUNTER — Inpatient Hospital Stay (HOSPITAL_BASED_OUTPATIENT_CLINIC_OR_DEPARTMENT_OTHER): Payer: 59 | Admitting: Hematology and Oncology

## 2024-03-17 DIAGNOSIS — C9000 Multiple myeloma not having achieved remission: Secondary | ICD-10-CM | POA: Diagnosis not present

## 2024-03-17 NOTE — Progress Notes (Signed)
 HEMATOLOGY-ONCOLOGY TELEPHONE VISIT PROGRESS NOTE  I connected with our patient on 03/17/24 at  3:00 PM EDT by telephone and verified that I am speaking with the correct person using two identifiers.  I discussed the limitations, risks, security and privacy concerns of performing an evaluation and management service by telephone and the availability of in person appointments.  I also discussed with the patient that there may be a patient responsible charge related to this service. The patient expressed understanding and agreed to proceed.   History of Present Illness:    History of Present Illness The patient, with a history of a bone marrow disorder, presents for a follow-up on her blood work results. She reports feeling good overall and was awaiting the interpretation of her results. The patient's M protein level, which was 1.4 grams last year, has decreased to 1.2 grams this year. She was unsure of the significance of these numbers and required clarification. The patient also reports slight anemia with a hemoglobin level of 10.7, with 12 being normal. She was anxious about the results but felt relieved after the explanation. She inquired about any dietary changes or activities she should engage in to improve her condition.    REVIEW OF SYSTEMS:   Constitutional: Denies fevers, chills or abnormal weight loss All other systems were reviewed with the patient and are negative. Observations/Objective:     Assessment Plan:  Multiple myeloma ([facility]) Smoldering myeloma: Diagnosed by Dr. Theotis Flake Bone marrow biopsy 08/11/2011: Plasma cells 12% IgG kappa MRI pelvis 09/01/2011: Abnormality left ischium is artifactual.  No bone lesions 09/17/2013: Bone survey: Solitary focal lytic lesion left inferior pubic ramus unchanged   12/16/2013: M protein 1.64 g, normal kappa lambda ratio, IgG 2140, IgM 5, hemoglobin 11.2 02/06/2019: M protein 1.4 g, hemoglobin 11.7 02/23/2020: Hemoglobin 11.9, creatinine, calcium  9.3, albumin 4.3 01/20/2022: Hemoglobin 11.2, WBC 3.9, ferritin 180  02/02/2022: Hemoglobin 11.4, Kappa 22.8, lambda 5.8, ratio 3.93, IgG 2085, M protein 1.3 g, creatinine 0.63, calcium 9.1, albumin 4.2 02/12/2023: IgG 2019, M protein 1.4 g IgG kappa, creatinine 0.68, albumin 4.2, calcium 8.8, hemoglobin 11.3 03/10/2024: Hemoglobin 10.7, SPEP: M protein: 1.2 g (was 1.4 g on 02/12/2023), Kappa 18.6, lambda 4.6, ratio 4, CMP: Normal   Labs appear to be fairly stable without any worsening of organ function Continue to watch and monitor with rechecking of labs again in 1 year and telephone visit 1 week later Assessment & Plan Smoldering multiple myeloma M protein decreased from 1.4 to 1.2 grams over the past year. Condition stable with no renal function changes. M protein fluctuations expected. No dietary restrictions necessary. - Recheck blood work in one year. - Encourage regular exercise.  Anemia, unspecified Mild anemia with hemoglobin at 10.7. No current concern, monitored with smoldering multiple myeloma management. - Monitor hemoglobin levels with routine blood work in one year.      I discussed the assessment and treatment plan with the patient. The patient was provided an opportunity to ask questions and all were answered. The patient agreed with the plan and demonstrated an understanding of the instructions. The patient was advised to call back or seek an in-person evaluation if the symptoms worsen or if the condition fails to improve as anticipated.   I provided 20 minutes of non-face-to-face time during this encounter.  This includes time for charting and coordination of care   Margert Sheerer, MD

## 2024-03-17 NOTE — Assessment & Plan Note (Signed)
 Smoldering myeloma: Diagnosed by Dr. Theotis Flake Bone marrow biopsy 08/11/2011: Plasma cells 12% IgG kappa MRI pelvis 09/01/2011: Abnormality left ischium is artifactual.  No bone lesions 09/17/2013: Bone survey: Solitary focal lytic lesion left inferior pubic ramus unchanged   12/16/2013: M protein 1.64 g, normal kappa lambda ratio, IgG 2140, IgM 5, hemoglobin 11.2 02/06/2019: M protein 1.4 g, hemoglobin 11.7 02/23/2020: Hemoglobin 11.9, creatinine, calcium 9.3, albumin 4.3 01/20/2022: Hemoglobin 11.2, WBC 3.9, ferritin 180  02/02/2022: Hemoglobin 11.4, Kappa 22.8, lambda 5.8, ratio 3.93, IgG 2085, M protein 1.3 g, creatinine 0.63, calcium 9.1, albumin 4.2 02/12/2023: IgG 2019, M protein 1.4 g IgG kappa, creatinine 0.68, albumin 4.2, calcium 8.8, hemoglobin 11.3 03/10/2024: Hemoglobin 10.7, SPEP: M protein: 1.2 g (was 1.4 g on 02/12/2023), Kappa 18.6, lambda 4.6, ratio 4, CMP: Normal   Labs appear to be fairly stable without any worsening of organ function Continue to watch and monitor with rechecking of labs again in 1 year and telephone visit 1 week later

## 2024-03-18 ENCOUNTER — Telehealth: Payer: Self-pay | Admitting: Hematology and Oncology

## 2024-03-18 NOTE — Telephone Encounter (Signed)
 Spoke with patient confirming upcoming appointments

## 2024-05-28 ENCOUNTER — Ambulatory Visit (HOSPITAL_COMMUNITY)
Admission: RE | Admit: 2024-05-28 | Discharge: 2024-05-28 | Disposition: A | Discharge status: Home / Self Care | Source: Ambulatory Visit | Attending: Vascular Surgery | Admitting: Vascular Surgery

## 2024-05-28 ENCOUNTER — Other Ambulatory Visit (HOSPITAL_COMMUNITY): Payer: Self-pay | Admitting: Family Medicine

## 2024-05-28 DIAGNOSIS — R609 Edema, unspecified: Secondary | ICD-10-CM

## 2025-03-13 ENCOUNTER — Inpatient Hospital Stay

## 2025-03-23 ENCOUNTER — Inpatient Hospital Stay: Admitting: Hematology and Oncology
